# Patient Record
Sex: Female | Born: 1940 | Race: Black or African American | Hispanic: No | Smoking: Never smoker
Health system: Southern US, Community
[De-identification: ages and names within clinical notes are randomized; demographics above are authoritative.]

## PROBLEM LIST (undated history)

## (undated) DIAGNOSIS — T82868A Thrombosis of vascular prosthetic devices, implants and grafts, initial encounter: Secondary | ICD-10-CM

## (undated) DIAGNOSIS — E785 Hyperlipidemia, unspecified: Secondary | ICD-10-CM

## (undated) DIAGNOSIS — E119 Type 2 diabetes mellitus without complications: Secondary | ICD-10-CM

## (undated) DIAGNOSIS — E079 Disorder of thyroid, unspecified: Secondary | ICD-10-CM

## (undated) DIAGNOSIS — I10 Essential (primary) hypertension: Secondary | ICD-10-CM

## (undated) DIAGNOSIS — M069 Rheumatoid arthritis, unspecified: Secondary | ICD-10-CM

## (undated) DIAGNOSIS — I2510 Atherosclerotic heart disease of native coronary artery without angina pectoris: Secondary | ICD-10-CM

## (undated) DIAGNOSIS — I509 Heart failure, unspecified: Secondary | ICD-10-CM

## (undated) HISTORY — PX: CATARACT EXTRACTION: SUR2

## (undated) HISTORY — PX: CARDIAC SURGERY: SHX584

## (undated) HISTORY — PX: BLEPHAROPLASTY: SUR158

## (undated) HISTORY — PX: ABDOMINAL HYSTERECTOMY: SHX81

---

## 2010-06-15 ENCOUNTER — Ambulatory Visit: Payer: Self-pay

## 2010-10-22 DIAGNOSIS — M069 Rheumatoid arthritis, unspecified: Secondary | ICD-10-CM | POA: Insufficient documentation

## 2010-12-15 DIAGNOSIS — M169 Osteoarthritis of hip, unspecified: Secondary | ICD-10-CM | POA: Insufficient documentation

## 2011-06-01 DIAGNOSIS — Z9229 Personal history of other drug therapy: Secondary | ICD-10-CM | POA: Insufficient documentation

## 2011-09-05 DIAGNOSIS — Z955 Presence of coronary angioplasty implant and graft: Secondary | ICD-10-CM | POA: Insufficient documentation

## 2011-09-05 DIAGNOSIS — G4730 Sleep apnea, unspecified: Secondary | ICD-10-CM | POA: Insufficient documentation

## 2011-09-05 DIAGNOSIS — E669 Obesity, unspecified: Secondary | ICD-10-CM | POA: Insufficient documentation

## 2011-09-05 DIAGNOSIS — I10 Essential (primary) hypertension: Secondary | ICD-10-CM | POA: Insufficient documentation

## 2011-09-05 DIAGNOSIS — M1990 Unspecified osteoarthritis, unspecified site: Secondary | ICD-10-CM | POA: Insufficient documentation

## 2012-11-26 DIAGNOSIS — R739 Hyperglycemia, unspecified: Secondary | ICD-10-CM | POA: Insufficient documentation

## 2013-03-05 DIAGNOSIS — M179 Osteoarthritis of knee, unspecified: Secondary | ICD-10-CM | POA: Insufficient documentation

## 2013-03-05 DIAGNOSIS — M1710 Unilateral primary osteoarthritis, unspecified knee: Secondary | ICD-10-CM | POA: Insufficient documentation

## 2014-12-26 ENCOUNTER — Encounter: Payer: Self-pay | Admitting: *Deleted

## 2014-12-26 ENCOUNTER — Inpatient Hospital Stay
Admission: EM | Admit: 2014-12-26 | Discharge: 2014-12-29 | DRG: 689 | Disposition: A | Discharge status: Home / Self Care | Payer: Medicare Other | Attending: Internal Medicine | Admitting: Internal Medicine

## 2014-12-26 ENCOUNTER — Emergency Department: Payer: Medicare Other

## 2014-12-26 DIAGNOSIS — Z7952 Long term (current) use of systemic steroids: Secondary | ICD-10-CM

## 2014-12-26 DIAGNOSIS — G9340 Encephalopathy, unspecified: Secondary | ICD-10-CM | POA: Diagnosis not present

## 2014-12-26 DIAGNOSIS — N390 Urinary tract infection, site not specified: Principal | ICD-10-CM | POA: Diagnosis present

## 2014-12-26 DIAGNOSIS — E119 Type 2 diabetes mellitus without complications: Secondary | ICD-10-CM

## 2014-12-26 DIAGNOSIS — R509 Fever, unspecified: Secondary | ICD-10-CM

## 2014-12-26 DIAGNOSIS — Z7982 Long term (current) use of aspirin: Secondary | ICD-10-CM

## 2014-12-26 DIAGNOSIS — M069 Rheumatoid arthritis, unspecified: Secondary | ICD-10-CM | POA: Diagnosis present

## 2014-12-26 DIAGNOSIS — I110 Hypertensive heart disease with heart failure: Secondary | ICD-10-CM | POA: Diagnosis present

## 2014-12-26 DIAGNOSIS — I2510 Atherosclerotic heart disease of native coronary artery without angina pectoris: Secondary | ICD-10-CM | POA: Diagnosis present

## 2014-12-26 DIAGNOSIS — Z833 Family history of diabetes mellitus: Secondary | ICD-10-CM

## 2014-12-26 DIAGNOSIS — B349 Viral infection, unspecified: Secondary | ICD-10-CM | POA: Diagnosis present

## 2014-12-26 DIAGNOSIS — R000 Tachycardia, unspecified: Secondary | ICD-10-CM | POA: Diagnosis present

## 2014-12-26 DIAGNOSIS — Z8249 Family history of ischemic heart disease and other diseases of the circulatory system: Secondary | ICD-10-CM

## 2014-12-26 DIAGNOSIS — E079 Disorder of thyroid, unspecified: Secondary | ICD-10-CM | POA: Diagnosis present

## 2014-12-26 DIAGNOSIS — Z823 Family history of stroke: Secondary | ICD-10-CM

## 2014-12-26 DIAGNOSIS — I509 Heart failure, unspecified: Secondary | ICD-10-CM | POA: Diagnosis present

## 2014-12-26 DIAGNOSIS — I10 Essential (primary) hypertension: Secondary | ICD-10-CM | POA: Diagnosis present

## 2014-12-26 DIAGNOSIS — E785 Hyperlipidemia, unspecified: Secondary | ICD-10-CM | POA: Diagnosis present

## 2014-12-26 HISTORY — DX: Hyperlipidemia, unspecified: E78.5

## 2014-12-26 HISTORY — DX: Atherosclerotic heart disease of native coronary artery without angina pectoris: I25.10

## 2014-12-26 HISTORY — DX: Thrombosis due to vascular prosthetic devices, implants and grafts, initial encounter: T82.868A

## 2014-12-26 HISTORY — DX: Rheumatoid arthritis, unspecified: M06.9

## 2014-12-26 HISTORY — DX: Essential (primary) hypertension: I10

## 2014-12-26 HISTORY — DX: Heart failure, unspecified: I50.9

## 2014-12-26 HISTORY — DX: Disorder of thyroid, unspecified: E07.9

## 2014-12-26 HISTORY — DX: Type 2 diabetes mellitus without complications: E11.9

## 2014-12-26 MED ORDER — SODIUM CHLORIDE 0.9 % IV BOLUS (SEPSIS)
500.0000 mL | INTRAVENOUS | Status: AC
  Administered 2014-12-27: 500 mL via INTRAVENOUS

## 2014-12-26 MED ORDER — VANCOMYCIN HCL IN DEXTROSE 1-5 GM/200ML-% IV SOLN
1000.0000 mg | Freq: Once | INTRAVENOUS | Status: AC
  Administered 2014-12-27: 1000 mg via INTRAVENOUS
  Filled 2014-12-26: qty 200

## 2014-12-26 MED ORDER — PIPERACILLIN-TAZOBACTAM 3.375 G IVPB 30 MIN
3.3750 g | Freq: Once | INTRAVENOUS | Status: AC
  Administered 2014-12-27: 3.375 g via INTRAVENOUS
  Filled 2014-12-26: qty 50

## 2014-12-26 MED ORDER — SODIUM CHLORIDE 0.9 % IV BOLUS (SEPSIS)
1000.0000 mL | INTRAVENOUS | Status: AC
  Administered 2014-12-26 (×2): 1000 mL via INTRAVENOUS

## 2014-12-26 NOTE — ED Notes (Signed)
Pt was at work today,  Left work, went home, family ans friends say she was not acting right.  Pt does not know date or time of day.  Pt tried to start car battery dead, walked home, then tried to open her garage door with heer car keys.

## 2014-12-27 ENCOUNTER — Inpatient Hospital Stay
Admit: 2014-12-27 | Discharge: 2014-12-27 | Disposition: A | Discharge status: Home / Self Care | Payer: Medicare Other | Attending: Internal Medicine | Admitting: Internal Medicine

## 2014-12-27 ENCOUNTER — Inpatient Hospital Stay: Payer: Medicare Other

## 2014-12-27 ENCOUNTER — Encounter: Payer: Self-pay | Admitting: Internal Medicine

## 2014-12-27 ENCOUNTER — Emergency Department: Payer: Medicare Other

## 2014-12-27 DIAGNOSIS — I509 Heart failure, unspecified: Secondary | ICD-10-CM | POA: Diagnosis present

## 2014-12-27 DIAGNOSIS — E119 Type 2 diabetes mellitus without complications: Secondary | ICD-10-CM

## 2014-12-27 DIAGNOSIS — I10 Essential (primary) hypertension: Secondary | ICD-10-CM | POA: Diagnosis present

## 2014-12-27 DIAGNOSIS — Z833 Family history of diabetes mellitus: Secondary | ICD-10-CM | POA: Diagnosis not present

## 2014-12-27 DIAGNOSIS — I2510 Atherosclerotic heart disease of native coronary artery without angina pectoris: Secondary | ICD-10-CM | POA: Diagnosis present

## 2014-12-27 DIAGNOSIS — R000 Tachycardia, unspecified: Secondary | ICD-10-CM | POA: Diagnosis present

## 2014-12-27 DIAGNOSIS — Z7952 Long term (current) use of systemic steroids: Secondary | ICD-10-CM | POA: Diagnosis not present

## 2014-12-27 DIAGNOSIS — B349 Viral infection, unspecified: Secondary | ICD-10-CM | POA: Diagnosis present

## 2014-12-27 DIAGNOSIS — N390 Urinary tract infection, site not specified: Secondary | ICD-10-CM | POA: Diagnosis present

## 2014-12-27 DIAGNOSIS — M069 Rheumatoid arthritis, unspecified: Secondary | ICD-10-CM | POA: Diagnosis present

## 2014-12-27 DIAGNOSIS — E079 Disorder of thyroid, unspecified: Secondary | ICD-10-CM | POA: Diagnosis present

## 2014-12-27 DIAGNOSIS — E785 Hyperlipidemia, unspecified: Secondary | ICD-10-CM | POA: Diagnosis present

## 2014-12-27 DIAGNOSIS — G9340 Encephalopathy, unspecified: Secondary | ICD-10-CM | POA: Diagnosis present

## 2014-12-27 DIAGNOSIS — Z823 Family history of stroke: Secondary | ICD-10-CM | POA: Diagnosis not present

## 2014-12-27 DIAGNOSIS — Z7982 Long term (current) use of aspirin: Secondary | ICD-10-CM | POA: Diagnosis not present

## 2014-12-27 DIAGNOSIS — I110 Hypertensive heart disease with heart failure: Secondary | ICD-10-CM | POA: Diagnosis present

## 2014-12-27 DIAGNOSIS — Z8249 Family history of ischemic heart disease and other diseases of the circulatory system: Secondary | ICD-10-CM | POA: Diagnosis not present

## 2014-12-27 LAB — CBC WITH DIFFERENTIAL/PLATELET
Basophils Absolute: 0 10*3/uL (ref 0–0.1)
Basophils Relative: 0 %
EOS ABS: 0 10*3/uL (ref 0–0.7)
EOS PCT: 0 %
HCT: 43.6 % (ref 35.0–47.0)
Hemoglobin: 14.3 g/dL (ref 12.0–16.0)
LYMPHS ABS: 0.8 10*3/uL — AB (ref 1.0–3.6)
LYMPHS PCT: 7 %
MCH: 28.1 pg (ref 26.0–34.0)
MCHC: 32.8 g/dL (ref 32.0–36.0)
MCV: 85.8 fL (ref 80.0–100.0)
MONO ABS: 1.9 10*3/uL — AB (ref 0.2–0.9)
MONOS PCT: 16 %
Neutro Abs: 9.2 10*3/uL — ABNORMAL HIGH (ref 1.4–6.5)
Neutrophils Relative %: 77 %
PLATELETS: 154 10*3/uL (ref 150–440)
RBC: 5.08 MIL/uL (ref 3.80–5.20)
RDW: 15 % — AB (ref 11.5–14.5)
WBC: 12.1 10*3/uL — AB (ref 3.6–11.0)

## 2014-12-27 LAB — CBC
HEMATOCRIT: 38.4 % (ref 35.0–47.0)
HEMOGLOBIN: 12.4 g/dL (ref 12.0–16.0)
MCH: 27.9 pg (ref 26.0–34.0)
MCHC: 32.4 g/dL (ref 32.0–36.0)
MCV: 86 fL (ref 80.0–100.0)
Platelets: 135 10*3/uL — ABNORMAL LOW (ref 150–440)
RBC: 4.46 MIL/uL (ref 3.80–5.20)
RDW: 15 % — ABNORMAL HIGH (ref 11.5–14.5)
WBC: 9.4 10*3/uL (ref 3.6–11.0)

## 2014-12-27 LAB — GLUCOSE, CAPILLARY
GLUCOSE-CAPILLARY: 158 mg/dL — AB (ref 65–99)
GLUCOSE-CAPILLARY: 143 mg/dL — AB (ref 65–99)
Glucose-Capillary: 147 mg/dL — ABNORMAL HIGH (ref 65–99)
Glucose-Capillary: 140 mg/dL — ABNORMAL HIGH (ref 65–99)

## 2014-12-27 LAB — COMPREHENSIVE METABOLIC PANEL
ALT: 17 U/L (ref 14–54)
ANION GAP: 10 (ref 5–15)
AST: 23 U/L (ref 15–41)
Albumin: 3.7 g/dL (ref 3.5–5.0)
Alkaline Phosphatase: 95 U/L (ref 38–126)
BILIRUBIN TOTAL: 0.7 mg/dL (ref 0.3–1.2)
BUN: 17 mg/dL (ref 6–20)
CALCIUM: 9.3 mg/dL (ref 8.9–10.3)
CO2: 27 mmol/L (ref 22–32)
Chloride: 97 mmol/L — ABNORMAL LOW (ref 101–111)
Creatinine, Ser: 1.04 mg/dL — ABNORMAL HIGH (ref 0.44–1.00)
GFR, EST AFRICAN AMERICAN: 59 mL/min — AB (ref >60)
GFR, EST NON AFRICAN AMERICAN: 51 mL/min — AB (ref >60)
Glucose, Bld: 121 mg/dL — ABNORMAL HIGH (ref 65–99)
POTASSIUM: 3.5 mmol/L (ref 3.5–5.1)
Sodium: 134 mmol/L — ABNORMAL LOW (ref 135–145)
TOTAL PROTEIN: 7.2 g/dL (ref 6.5–8.1)

## 2014-12-27 LAB — BASIC METABOLIC PANEL
ANION GAP: 6 (ref 5–15)
BUN: 13 mg/dL (ref 6–20)
CHLORIDE: 102 mmol/L (ref 101–111)
CO2: 29 mmol/L (ref 22–32)
Calcium: 8.4 mg/dL — ABNORMAL LOW (ref 8.9–10.3)
Creatinine, Ser: 1.04 mg/dL — ABNORMAL HIGH (ref 0.44–1.00)
GFR calc Af Amer: 59 mL/min — ABNORMAL LOW (ref >60)
GFR, EST NON AFRICAN AMERICAN: 51 mL/min — AB (ref >60)
GLUCOSE: 164 mg/dL — AB (ref 65–99)
POTASSIUM: 3.5 mmol/L (ref 3.5–5.1)
Sodium: 137 mmol/L (ref 135–145)

## 2014-12-27 LAB — HEMOGLOBIN A1C: HEMOGLOBIN A1C: 6.7 % — AB (ref 4.0–6.0)

## 2014-12-27 LAB — URINALYSIS COMPLETE WITH MICROSCOPIC (ARMC ONLY)
BACTERIA UA: NONE SEEN
Bilirubin Urine: NEGATIVE
Glucose, UA: NEGATIVE mg/dL
Hgb urine dipstick: NEGATIVE
Nitrite: NEGATIVE
PH: 6 (ref 5.0–8.0)
PROTEIN: NEGATIVE mg/dL
Specific Gravity, Urine: 1.02 (ref 1.005–1.030)
Squamous Epithelial / LPF: NONE SEEN

## 2014-12-27 LAB — TROPONIN I: Troponin I: <0.03 ng/mL (ref <0.031)

## 2014-12-27 LAB — LIPID PANEL
Cholesterol: 176 mg/dL (ref 0–200)
HDL: 97 mg/dL (ref >40)
LDL CALC: 71 mg/dL (ref 0–99)
TRIGLYCERIDES: 40 mg/dL (ref <150)
Total CHOL/HDL Ratio: 1.8 RATIO
VLDL: 8 mg/dL (ref 0–40)

## 2014-12-27 LAB — APTT: APTT: 30 s (ref 24–36)

## 2014-12-27 LAB — LIPASE, BLOOD: Lipase: 39 U/L (ref 11–51)

## 2014-12-27 LAB — T4, FREE: Free T4: 0.95 ng/dL (ref 0.61–1.12)

## 2014-12-27 LAB — PROTIME-INR
INR: 1.07
Prothrombin Time: 14.1 seconds (ref 11.4–15.0)

## 2014-12-27 LAB — TSH: TSH: 0.85 u[IU]/mL (ref 0.350–4.500)

## 2014-12-27 LAB — LACTIC ACID, PLASMA: Lactic Acid, Venous: 1.4 mmol/L (ref 0.5–2.0)

## 2014-12-27 MED ORDER — CARVEDILOL 3.125 MG PO TABS: 3.1250 mg | ORAL_TABLET | Freq: Three times a day (TID) | ORAL | Status: DC

## 2014-12-27 MED ORDER — INFLUENZA VAC SPLIT QUAD 0.5 ML IM SUSY
0.5000 mL | PREFILLED_SYRINGE | INTRAMUSCULAR | Status: AC
  Administered 2014-12-28: 13:00:00 0.5 mL via INTRAMUSCULAR
  Filled 2014-12-27: qty 0.5

## 2014-12-27 MED ORDER — VANCOMYCIN HCL IN DEXTROSE 1-5 GM/200ML-% IV SOLN
1000.0000 mg | INTRAVENOUS | Status: DC
  Administered 2014-12-27: 18:00:00 1000 mg via INTRAVENOUS
  Filled 2014-12-27 (×4): qty 200

## 2014-12-27 MED ORDER — DIPHENHYDRAMINE HCL 50 MG/ML IJ SOLN
25.0000 mg | Freq: Once | INTRAMUSCULAR | Status: AC
  Administered 2014-12-27: 25 mg via INTRAVENOUS
  Filled 2014-12-27: qty 1

## 2014-12-27 MED ORDER — ACETAMINOPHEN 325 MG PO TABS
650.0000 mg | ORAL_TABLET | Freq: Four times a day (QID) | ORAL | Status: DC | PRN
  Administered 2014-12-27 – 2014-12-28 (×4): 650 mg via ORAL
  Filled 2014-12-27 (×4): qty 2

## 2014-12-27 MED ORDER — ACETAMINOPHEN 500 MG PO TABS
1000.0000 mg | ORAL_TABLET | Freq: Once | ORAL | Status: AC
  Administered 2014-12-27: 1000 mg via ORAL
  Filled 2014-12-27: qty 2

## 2014-12-27 MED ORDER — ACETAMINOPHEN 650 MG RE SUPP: 650.0000 mg | Freq: Four times a day (QID) | RECTAL | Status: DC | PRN

## 2014-12-27 MED ORDER — PIPERACILLIN-TAZOBACTAM 3.375 G IVPB
3.3750 g | Freq: Three times a day (TID) | INTRAVENOUS | Status: DC
  Administered 2014-12-27 – 2014-12-28 (×4): 3.375 g via INTRAVENOUS
  Filled 2014-12-27 (×6): qty 50

## 2014-12-27 MED ORDER — SODIUM CHLORIDE 0.9 % IJ SOLN
3.0000 mL | Freq: Two times a day (BID) | INTRAMUSCULAR | Status: DC
  Administered 2014-12-27 – 2014-12-29 (×5): 3 mL via INTRAVENOUS

## 2014-12-27 MED ORDER — ONDANSETRON HCL 4 MG/2ML IJ SOLN: 4.0000 mg | Freq: Four times a day (QID) | INTRAMUSCULAR | Status: DC | PRN

## 2014-12-27 MED ORDER — CARVEDILOL 3.125 MG PO TABS
3.1250 mg | ORAL_TABLET | Freq: Two times a day (BID) | ORAL | Status: DC
  Administered 2014-12-27 – 2014-12-29 (×5): 3.125 mg via ORAL
  Filled 2014-12-27 (×5): qty 1

## 2014-12-27 MED ORDER — ONDANSETRON HCL 4 MG PO TABS: 4.0000 mg | ORAL_TABLET | Freq: Four times a day (QID) | ORAL | Status: DC | PRN

## 2014-12-27 MED ORDER — ASPIRIN EC 81 MG PO TBEC
81.0000 mg | DELAYED_RELEASE_TABLET | Freq: Every day | ORAL | Status: DC
  Administered 2014-12-27 – 2014-12-29 (×3): 81 mg via ORAL
  Filled 2014-12-27 (×3): qty 1

## 2014-12-27 MED ORDER — INSULIN ASPART 100 UNIT/ML ~~LOC~~ SOLN
0.0000 [IU] | Freq: Three times a day (TID) | SUBCUTANEOUS | Status: DC
  Administered 2014-12-27: 18:00:00 2 [IU] via SUBCUTANEOUS
  Administered 2014-12-28: 13:00:00 1 [IU] via SUBCUTANEOUS
  Administered 2014-12-28: 2 [IU] via SUBCUTANEOUS
  Administered 2014-12-29: 1 [IU] via SUBCUTANEOUS
  Filled 2014-12-27: qty 2
  Filled 2014-12-27 (×2): qty 1
  Filled 2014-12-27: qty 2

## 2014-12-27 MED ORDER — PREDNISONE 1 MG PO TABS
2.0000 mg | ORAL_TABLET | Freq: Every day | ORAL | Status: DC
  Administered 2014-12-27 – 2014-12-29 (×3): 2 mg via ORAL
  Filled 2014-12-27 (×4): qty 2

## 2014-12-27 MED ORDER — STROKE: EARLY STAGES OF RECOVERY BOOK
Freq: Once | Status: AC
  Administered 2014-12-27: 05:00:00

## 2014-12-27 MED ORDER — ATORVASTATIN CALCIUM 10 MG PO TABS
10.0000 mg | ORAL_TABLET | Freq: Every day | ORAL | Status: DC
  Administered 2014-12-27 – 2014-12-29 (×3): 10 mg via ORAL
  Filled 2014-12-27 (×3): qty 1

## 2014-12-27 MED ORDER — METHOTREXATE 2.5 MG PO TABS
2.5000 mg | ORAL_TABLET | Freq: Every day | ORAL | Status: DC
  Administered 2014-12-27 – 2014-12-29 (×3): 2.5 mg via ORAL
  Filled 2014-12-27 (×3): qty 1

## 2014-12-27 MED ORDER — METOCLOPRAMIDE HCL 5 MG/ML IJ SOLN
10.0000 mg | Freq: Once | INTRAMUSCULAR | Status: AC
  Administered 2014-12-27: 10 mg via INTRAVENOUS
  Filled 2014-12-27: qty 2

## 2014-12-27 MED ORDER — ENOXAPARIN SODIUM 40 MG/0.4ML ~~LOC~~ SOLN
40.0000 mg | Freq: Every day | SUBCUTANEOUS | Status: DC
Start: 2014-12-27 — End: 2014-12-29
  Administered 2014-12-27 – 2014-12-29 (×3): 40 mg via SUBCUTANEOUS
  Filled 2014-12-27 (×3): qty 0.4

## 2014-12-27 NOTE — Consult Note (Signed)
CC: confusion   HPI: Tammy Obrien is an 74 y.o. female  who presents with acute encephalopathy. . Patient family state that the patient was found this evening at her home after having some amount of confusion during a telephone conversation with her daughter. Her other daughter subsequently went to her house to check on her and found her outside of her garage trying to open it with her car key. Pt is slow to respond but no focal abnormalities.   Past Medical History  Diagnosis Date  . CHF (congestive heart failure) (HCC)   . Thyroid disease   . Hypertension   . RA (rheumatoid arthritis) (HCC)   . Arterial stent thrombosis (HCC)   . CAD (coronary artery disease)   . HLD (hyperlipidemia)   . Type 2 diabetes mellitus [facility])     Past Surgical History  Procedure Laterality Date  . Abdominal hysterectomy    . Cataract extraction    . Cardiac surgery    . Blepharoplasty      Family History  Problem Relation Age of Onset  . Hypertension Mother   . Cancer Mother   . Glaucoma Mother   . Cancer Father   . Heart attack Sister   . Diabetes Brother   . Stroke Brother   . Glaucoma Brother     Social History:  reports that she has never smoked. She has never used smokeless tobacco. She reports that she does not drink alcohol or use illicit drugs.  Allergies  Allergen Reactions  . Enalaprilat   . Flexeril [Cyclobenzaprine] Itching  . Verapamil Itching  . Zocor [Simvastatin] Itching    Medications: I have reviewed the patient's current medications.  ROS: Not able to obtain due to confusion   Physical Examination: Blood pressure 154/61, pulse 86, temperature 102.5 F (39.2 C), temperature source Oral, resp. rate 20, height 5\' 6"  (1.676 m), weight 161 lb 9.6 oz (73.301 kg), SpO2 99 %.    Neurological Examination Mental Status: Tells me name and date  Cranial Nerves: II: Discs flat bilaterally; Visual fields grossly normal, pupils equal, round, reactive to light and  accommodation III,IV, VI: ptosis not present, extra-ocular motions intact bilaterally V,VII: smile symmetric, facial light touch sensation normal bilaterally VIII: hearing normal bilaterally IX,X: gag reflex present XI: bilateral shoulder shrug XII: midline tongue extension Motor: Right : Upper extremity   5/5    Left:     Upper extremity   5/5  Lower extremity   4+/5     Lower extremity   4+/5 Tone and bulk:normal tone throughout; no atrophy noted Sensory: Pinprick and light touch intact throughout, bilaterally Deep Tendon Reflexes: 2+ and symmetric throughout Plantars: Right: downgoing   Left: downgoing Cerebellar: normal finger-to-nose, normal rapid alternating movements and normal heel-to-shin test Gait: normal gait and station      Laboratory Studies:   Basic Metabolic Panel:  Recent Labs Lab 12/26/14 2341 12/27/14 0621  NA 134* 137  K 3.5 3.5  CL 97* 102  CO2 27 29  GLUCOSE 121* 164*  BUN 17 13  CREATININE 1.04* 1.04*  CALCIUM 9.3 8.4*    Liver Function Tests:  Recent Labs Lab 12/26/14 2341  AST 23  ALT 17  ALKPHOS 95  BILITOT 0.7  PROT 7.2  ALBUMIN 3.7    Recent Labs Lab 12/26/14 2341  LIPASE 39   No results for input(s): AMMONIA in the last 168 hours.  CBC:  Recent Labs Lab 12/26/14 2341 12/27/14 0621  WBC 12.1* 9.4  NEUTROABS 9.2*  --   HGB 14.3 12.4  HCT 43.6 38.4  MCV 85.8 86.0  PLT 154 135*    Cardiac Enzymes:  Recent Labs Lab 12/26/14 2341  TROPONINI <0.03    BNP: Invalid input(s): POCBNP  CBG:  Recent Labs Lab 12/27/14 0953 12/27/14 1311  GLUCAP 147* 140*    Microbiology: No results found for this or any previous visit.  Coagulation Studies:  Recent Labs  12/26/14 2341  LABPROT 14.1  INR 1.07    Urinalysis:  Recent Labs Lab 12/27/14  COLORURINE YELLOW*  LABSPEC 1.020  PHURINE 6.0  GLUCOSEU NEGATIVE  HGBUR NEGATIVE  BILIRUBINUR NEGATIVE  KETONESUR TRACE*  PROTEINUR NEGATIVE  NITRITE  NEGATIVE  LEUKOCYTESUR TRACE*    Lipid Panel:     Component Value Date/Time   CHOL 176 12/27/2014 0621   TRIG 40 12/27/2014 0621   HDL 97 12/27/2014 0621   CHOLHDL 1.8 12/27/2014 0621   VLDL 8 12/27/2014 0621   LDLCALC 71 12/27/2014 0621    HgbA1C: No results found for: HGBA1C  Urine Drug Screen:  No results found for: LABOPIA, COCAINSCRNUR, LABBENZ, AMPHETMU, THCU, LABBARB  Alcohol Level: No results for input(s): ETH in the last 168 hours.  Other results: EKG: normal EKG, normal sinus rhythm, unchanged from previous tracings.  Imaging: Ct Head Wo Contrast  12/27/2014  CLINICAL DATA:  Altered mental status, acute onset. Initial encounter. EXAM: CT HEAD WITHOUT CONTRAST TECHNIQUE: Contiguous axial images were obtained from the base of the skull through the vertex without intravenous contrast. COMPARISON:  None. FINDINGS: There is no evidence of acute infarction, mass lesion, or intra- or extra-axial hemorrhage on CT. The posterior fossa, including the cerebellum, brainstem and fourth ventricle, is within normal limits. The third and lateral ventricles, and basal ganglia are unremarkable in appearance. The cerebral hemispheres are symmetric in appearance, with normal gray-white differentiation. No mass effect or midline shift is seen. There is no evidence of fracture; visualized osseous structures are unremarkable in appearance. The visualized portions of the orbits are within normal limits. The paranasal sinuses and mastoid air cells are well-aerated. No significant soft tissue abnormalities are seen. IMPRESSION: Unremarkable noncontrast CT of the head. Electronically Signed   By: Roanna Raider M.D.   On: 12/27/2014 00:39   Mr Brain Wo Contrast  12/27/2014  CLINICAL DATA:  Acute encephalopathy. Confusion. Patient is febrile, possible sepsis. History of diabetes. History of hyperlipidemia and hypertension. EXAM: MRI HEAD WITHOUT CONTRAST MRA HEAD WITHOUT CONTRAST TECHNIQUE: Multiplanar,  multiecho pulse sequences of the brain and surrounding structures were obtained without intravenous contrast. Angiographic images of the head were obtained using MRA technique without contrast. COMPARISON:  CT head earlier today. FINDINGS: The patient was unable to remain motionless for the exam. Small or subtle lesions could be overlooked. MRI HEAD FINDINGS No visible restricted diffusion. No hemorrhage, mass lesion, hydrocephalus, or extra-axial fluid. Generalized atrophy. Mild subcortical and periventricular T2 and FLAIR hyperintensities, likely chronic microvascular ischemic change. Pituitary and cerebellar tonsils unremarkable. Cervical spondylosis with suspected disc osteophyte complex at C3-4. No visible foci of chronic hemorrhage. Flow voids are maintained. Extracranial soft tissues grossly unremarkable. Compared with prior CT, good general agreement. MRA HEAD FINDINGS Motion degraded exam. BILATERAL cavernous carotid narrowing inferiorly is felt to be artifactual, but focal stenoses are not excluded. Supraclinoid ICAs widely patent. Basilar artery widely patent with vertebrals codominant. Unremarkable proximal anterior and middle cerebral artery on the RIGHT. Mildly irregular proximal LEFT middle cerebral artery. Severely diseased LEFT A1 ACA.  LEFT PCA demonstrates a mixed fetal/native origin from the basilar, with focal narrowing at the proximal LEFT P2 segment potentially flow reducing. Mild irregularity of the distal MCA and PCA branches suggesting intracranial atherosclerotic change. No intracranial aneurysm. IMPRESSION: Motion degraded MRI brain exam demonstrating atrophy and small vessel disease without definite acute intracranial findings. Motion degraded MRA with suspected artifactual narrowing of the cavernous carotid arteries bilaterally. Moderately diseased LEFT anterior cerebral artery proximally. Electronically Signed   By: Elsie Stain M.D.   On: 12/27/2014 12:19   US Carotid  Bilateral  12/27/2014  CLINICAL DATA:  Acute encephalopathy. Hypertension, stroke, diabetes. EXAM: BILATERAL CAROTID DUPLEX ULTRASOUND TECHNIQUE: Wallace Cullens scale imaging, color Doppler and duplex ultrasound was performed of bilateral carotid and vertebral arteries in the neck. COMPARISON:  None. REVIEW OF SYSTEMS: Quantification of carotid stenosis is based on velocity parameters that correlate the residual internal carotid diameter with NASCET-based stenosis levels, using the diameter of the distal internal carotid lumen as the denominator for stenosis measurement. The following velocity measurements were obtained: PEAK SYSTOLIC/END DIASTOLIC RIGHT ICA:                     103/18cm/sec CCA:                     118/17cm/sec SYSTOLIC ICA/CCA RATIO:  0.88 DIASTOLIC ICA/CCA RATIO: 1.07 ECA:                     82cm/sec LEFT ICA:                     83 /11cm/sec CCA:                     116/12cm/sec SYSTOLIC ICA/CCA RATIO:  0.71 DIASTOLIC ICA/CCA RATIO: 1.46 ECA:                     83cm/sec FINDINGS: RIGHT CAROTID ARTERY: Eccentric partially calcified plaque in the bulb extending into the proximal ICA, without high-grade stenosis. Normal waveforms and color Doppler signal. RIGHT VERTEBRAL ARTERY:  Normal flow direction and waveform. LEFT CAROTID ARTERY: Intimal thickening through the common carotid artery. Eccentric noncalcified plaque in the bulb and proximal ICA. No high-grade stenosis. Normal waveforms and color Doppler signal. LEFT VERTEBRAL ARTERY: Normal flow direction and waveform. IMPRESSION: 1. Bilateral carotid bifurcation and proximal ICA plaque resulting in less than 50% diameter stenosis. The exam does not exclude plaque ulceration or embolization. Continued surveillance recommended. Electronically Signed   By: Corlis Leak M.D.   On: 12/27/2014 11:13   Dg Chest Port 1 View  12/26/2014  CLINICAL DATA:  Altered mental status EXAM: PORTABLE CHEST 1 VIEW COMPARISON:  None. FINDINGS: Lungs are clear. Heart size  and pulmonary vascularity are normal. No adenopathy. No bone lesions. IMPRESSION: No edema or consolidation. Electronically Signed   By: Bretta Bang III M.D.   On: 12/26/2014 23:59   Mr Palma Holter  12/27/2014  CLINICAL DATA:  Acute encephalopathy. Confusion. Patient is febrile, possible sepsis. History of diabetes. History of hyperlipidemia and hypertension. EXAM: MRI HEAD WITHOUT CONTRAST MRA HEAD WITHOUT CONTRAST TECHNIQUE: Multiplanar, multiecho pulse sequences of the brain and surrounding structures were obtained without intravenous contrast. Angiographic images of the head were obtained using MRA technique without contrast. COMPARISON:  CT head earlier today. FINDINGS: The patient was unable to remain motionless for the exam. Small or subtle lesions could be overlooked. MRI HEAD FINDINGS No visible  restricted diffusion. No hemorrhage, mass lesion, hydrocephalus, or extra-axial fluid. Generalized atrophy. Mild subcortical and periventricular T2 and FLAIR hyperintensities, likely chronic microvascular ischemic change. Pituitary and cerebellar tonsils unremarkable. Cervical spondylosis with suspected disc osteophyte complex at C3-4. No visible foci of chronic hemorrhage. Flow voids are maintained. Extracranial soft tissues grossly unremarkable. Compared with prior CT, good general agreement. MRA HEAD FINDINGS Motion degraded exam. BILATERAL cavernous carotid narrowing inferiorly is felt to be artifactual, but focal stenoses are not excluded. Supraclinoid ICAs widely patent. Basilar artery widely patent with vertebrals codominant. Unremarkable proximal anterior and middle cerebral artery on the RIGHT. Mildly irregular proximal LEFT middle cerebral artery. Severely diseased LEFT A1 ACA. LEFT PCA demonstrates a mixed fetal/native origin from the basilar, with focal narrowing at the proximal LEFT P2 segment potentially flow reducing. Mild irregularity of the distal MCA and PCA branches suggesting intracranial  atherosclerotic change. No intracranial aneurysm. IMPRESSION: Motion degraded MRI brain exam demonstrating atrophy and small vessel disease without definite acute intracranial findings. Motion degraded MRA with suspected artifactual narrowing of the cavernous carotid arteries bilaterally. Moderately diseased LEFT anterior cerebral artery proximally. Electronically Signed   By: Elsie Stain M.D.   On: 12/27/2014 12:19     Assessment/Plan:  74 y.o. female  who presents with acute encephalopathy. . Patient family state that the patient was found this evening at her home after having some amount of confusion during a telephone conversation with her daughter. Her other daughter subsequently went to her house to check on her and found her outside of her garage trying to open it with her car key. Pt is slow to respond but no focal abnormalities.   MRI/MRA no acute abnormalities Fever of 102, but no Kernig and Burdinski signs Very unlikely to be bacterial meningitis Slight UTI that is being treated If still febrile in 1-2 days would obtain LP under IR S/p discussion with family at bedside Pauletta Browns  12/27/2014, 2:35 PM

## 2014-12-27 NOTE — Plan of Care (Signed)
Problem: Discharge Progression Outcomes Goal: Other Discharge Outcomes/Goals Outcome: Progressing Plan of care progress to goals: 1. No c/o pain, resting comfortably in bed.  2. Hemodynamically:             -VSS, afebrile              -NIH 0, intermittent difficulty formulating thoughts into words             -MRI, Echo, US Carotid to be completed today  3. Tolerating carb modified diet, swallowing without problem  4. Moderate fall risk. Bed alarm on, hourly rounding. Understands how to call for assistance.

## 2014-12-27 NOTE — Progress Notes (Signed)
OT Cancellation Note  Patient Details Name: Tammy Obrien MRN: [id number] DOB: Sep 12, 1940   Cancelled Treatment:    Reason Eval/Treat Not Completed: Patient at procedure or test/ unavailable: Out for MRI, Echo, and [place] of Carotids.  [name] Toma Copier, MOTR/L 12/27/2014, 11:14 AM

## 2014-12-27 NOTE — Progress Notes (Signed)
[facility] [place] [facility]   PATIENT NAME: Tammy Obrien    MR#:  [id number]  DATE OF BIRTH:  [birth year]  SUBJECTIVE:  Patient appears slightly confused. She is able to answer all questions properly but is very slow.  REVIEW OF SYSTEMS:    Review of Systems  Constitutional: Negative for fever, chills and malaise/fatigue.  HENT: Negative for sore throat.   Eyes: Negative for blurred vision.  Respiratory: Negative for cough, hemoptysis, shortness of breath and wheezing.   Cardiovascular: Negative for chest pain, palpitations and leg swelling.  Gastrointestinal: Negative for nausea, vomiting, abdominal pain, diarrhea and blood in stool.  Genitourinary: Negative for dysuria.  Musculoskeletal: Negative for back pain.  Neurological: Negative for dizziness, tremors, focal weakness and headaches.  Endo/Heme/Allergies: Does not bruise/bleed easily.    Tolerating Diet: Yes      DRUG ALLERGIES:   Allergies  Allergen Reactions  . Enalaprilat   . Flexeril [Cyclobenzaprine] Itching  . Verapamil Itching  . Zocor [Simvastatin] Itching    VITALS:  Blood pressure 135/89, pulse 80, temperature 98.9 F (37.2 C), temperature source Oral, resp. rate 18, height 5\' 6"  (1.676 m), weight 73.301 kg (161 lb 9.6 oz), SpO2 100 %.  PHYSICAL EXAMINATION:   Physical Exam  Constitutional: She is oriented to person, place, and time and well-developed, well-nourished, and in no distress. No distress.  HENT:  Head: Normocephalic.  Eyes: No scleral icterus.  Neck: Normal range of motion. Neck supple. No JVD present. No tracheal deviation present.  Cardiovascular: Normal rate, regular rhythm and normal heart sounds.  Exam reveals no gallop and no friction rub.   No murmur heard. Pulmonary/Chest: Effort normal and breath sounds normal. No respiratory distress. She has no wheezes. She has no rales. She exhibits no tenderness.  Abdominal: Soft. Bowel sounds are  normal. She exhibits no distension and no mass. There is no tenderness. There is no rebound and no guarding.  Musculoskeletal: Normal range of motion. She exhibits no edema.  Neurological: She is alert and oriented to person, place, and time. Coordination abnormal.  SEEMS CONFUSED AT TIMES AND IS ABLE TO PERFORM LEFT HAND FINGER TO NOSE TEST WELL  Skin: Skin is warm. No rash noted. No erythema.  Psychiatric: Affect and judgment normal.      LABORATORY PANEL:   CBC  Recent Labs Lab 12/27/14 0621  WBC 9.4  HGB 12.4  HCT 38.4  PLT 135*   ------------------------------------------------------------------------------------------------------------------  Chemistries   Recent Labs Lab 12/26/14 2341 12/27/14 0621  NA 134* 137  K 3.5 3.5  CL 97* 102  CO2 27 29  GLUCOSE 121* 164*  BUN 17 13  CREATININE 1.04* 1.04*  CALCIUM 9.3 8.4*  AST 23  --   ALT 17  --   ALKPHOS 95  --   BILITOT 0.7  --    ------------------------------------------------------------------------------------------------------------------  Cardiac Enzymes  Recent Labs Lab 12/26/14 2341  TROPONINI <0.03   ------------------------------------------------------------------------------------------------------------------  RADIOLOGY:  Ct Head Wo Contrast  12/27/2014  CLINICAL DATA:  Altered mental status, acute onset. Initial encounter. EXAM: CT HEAD WITHOUT CONTRAST TECHNIQUE: Contiguous axial images were obtained from the base of the skull through the vertex without intravenous contrast. COMPARISON:  None. FINDINGS: There is no evidence of acute infarction, mass lesion, or intra- or extra-axial hemorrhage on CT. The posterior fossa, including the cerebellum, brainstem and fourth ventricle, is within normal limits. The third and lateral ventricles, and basal ganglia are unremarkable in appearance. The cerebral hemispheres  are symmetric in appearance, with normal gray-white differentiation. No mass effect  or midline shift is seen. There is no evidence of fracture; visualized osseous structures are unremarkable in appearance. The visualized portions of the orbits are within normal limits. The paranasal sinuses and mastoid air cells are well-aerated. No significant soft tissue abnormalities are seen. IMPRESSION: Unremarkable noncontrast CT of the head. Electronically Signed   By: Roanna Raider M.D.   On: 12/27/2014 00:39   US Carotid Bilateral  12/27/2014  CLINICAL DATA:  Acute encephalopathy. Hypertension, stroke, diabetes. EXAM: BILATERAL CAROTID DUPLEX ULTRASOUND TECHNIQUE: Wallace Cullens scale imaging, color Doppler and duplex ultrasound was performed of bilateral carotid and vertebral arteries in the neck. COMPARISON:  None. REVIEW OF SYSTEMS: Quantification of carotid stenosis is based on velocity parameters that correlate the residual internal carotid diameter with NASCET-based stenosis levels, using the diameter of the distal internal carotid lumen as the denominator for stenosis measurement. The following velocity measurements were obtained: PEAK SYSTOLIC/END DIASTOLIC RIGHT ICA:                     103/18cm/sec CCA:                     118/17cm/sec SYSTOLIC ICA/CCA RATIO:  0.88 DIASTOLIC ICA/CCA RATIO: 1.07 ECA:                     82cm/sec LEFT ICA:                     83 /11cm/sec CCA:                     116/12cm/sec SYSTOLIC ICA/CCA RATIO:  0.71 DIASTOLIC ICA/CCA RATIO: 1.46 ECA:                     83cm/sec FINDINGS: RIGHT CAROTID ARTERY: Eccentric partially calcified plaque in the bulb extending into the proximal ICA, without high-grade stenosis. Normal waveforms and color Doppler signal. RIGHT VERTEBRAL ARTERY:  Normal flow direction and waveform. LEFT CAROTID ARTERY: Intimal thickening through the common carotid artery. Eccentric noncalcified plaque in the bulb and proximal ICA. No high-grade stenosis. Normal waveforms and color Doppler signal. LEFT VERTEBRAL ARTERY: Normal flow direction and waveform.  IMPRESSION: 1. Bilateral carotid bifurcation and proximal ICA plaque resulting in less than 50% diameter stenosis. The exam does not exclude plaque ulceration or embolization. Continued surveillance recommended. Electronically Signed   By: Corlis Leak M.D.   On: 12/27/2014 11:13   Dg Chest Port 1 View  12/26/2014  CLINICAL DATA:  Altered mental status EXAM: PORTABLE CHEST 1 VIEW COMPARISON:  None. FINDINGS: Lungs are clear. Heart size and pulmonary vascularity are normal. No adenopathy. No bone lesions. IMPRESSION: No edema or consolidation. Electronically Signed   By: Bretta Bang III M.D.   On: 12/26/2014 23:59     ASSESSMENT AND PLAN:   This is a 74 year old female who presented with acute encephalopathy.  1. Acute encephalopathy: This is likely secondary to stroke. Patient is to undergo stroke workup. Carotid Dopplers showed no hemodynamically significant stenosis. MRI and echocardiogram are pending.  Continue aspirin and statin.  2. Essential hypertension: Continue Coreg  3. Hyperlipidemia: LDL is at goal. Continue atorvastatin  4. Diabetes type 2: Continue ADA diet and signs on 7  5. Rheumatoid arthritis: Continue methotrexate and prednisone  Management plans discussed with the patient and family she is in agreement.  CODE STATUS: FULL  TOTAL  TIME TAKING CARE OF THIS PATIENT: 30 minutes.     POSSIBLE D/C 1 days, DEPENDING ON CLINICAL CONDITION.   Ihsan Nomura M.D on 12/27/2014 at 11:21 AM  Between 7am to 6pm - Pager - [phone] After 6pm go to [email or web address] - password [email or web address]  [facility]  Office  [phone]  CC: Primary care physician; Leim Fabry, MD  Note: This dictation was prepared with Dragon dictation along with smaller phrase technology. Any transcriptional errors that result from this process are unintentional.

## 2014-12-27 NOTE — Progress Notes (Signed)
Physical Therapy Evaluation Patient Details Name: Tammy Obrien MRN: [id number] DOB: Apr 20, 1940 Today's Date: 12/27/2014   History of Present Illness  Patient is a 74 y.o. female admitted on 28 Oct. for acute encephalopathy. Patient has hx of DMII, HTN, hyperlipidemia, CAD, and RA.  Clinical Impression  Patient is a 74 y.o. Female who was previously independent before recent admission. Patient was oriented to person and place at time of evaluation. Required daughter at bedside to explain why she was at [facility]. Patient demonstrated independence with bed mobility and transfers but required HHA for standing dynamic balance activities and walking. Patient will benefit from a [facility] at home to allow for improved balance and decreased risk for falls once discharged at cognitive baseline. No further f/u is indicated for PT at this time.    Follow Up Recommendations No PT follow up    Equipment Recommendations  Cane    Recommendations for Other Services       Precautions / Restrictions Precautions Precautions: None Restrictions Weight Bearing Restrictions: No      Mobility  Bed Mobility Overal bed mobility: Independent                Transfers Overall transfer level: Independent Equipment used: 1 person hand held assist                Ambulation/Gait Ambulation/Gait assistance: Modified independent (Device/Increase time) Ambulation Distance (Feet): 230 Feet Assistive device: 1 person hand held assist Gait Pattern/deviations: WFL(Within Functional Limits)     General Gait Details: Patient ambulates at decreased cadence. Was able to perform head turns with no LOB with HHA.   Stairs            Wheelchair Mobility    Modified Rankin (Stroke Patients Only)       Balance Overall balance assessment: Needs assistance Sitting-balance support: No upper extremity supported Sitting balance-Leahy Scale: Good     Standing balance support: Single extremity  supported Standing balance-Leahy Scale: Good Standing balance comment: Patient able to stand with feet hip width apart and UE supported Single Leg Stance - Right Leg: 10 (Needs HHA) Single Leg Stance - Left Leg: 10 (needs HHA)         High level balance activites: Head turns;Other (comment) (Able to perform with HHA)               Pertinent Vitals/Pain Pain Assessment: No/denies pain    Home Living Family/patient expects to be discharged to:: Private residence Living Arrangements: Alone Available Help at Discharge: Family;Available PRN/intermittently Type of Home: House Home Access: Stairs to enter Entrance Stairs-Rails: Can reach both Entrance Stairs-Number of Steps: 4 Home Layout: One level Home Equipment: None      Prior Function Level of Independence: Independent               Hand Dominance        Extremity/Trunk Assessment   Upper Extremity Assessment: Overall WFL for tasks assessed           Lower Extremity Assessment: Overall WFL for tasks assessed         Communication   Communication: No difficulties  Cognition Arousal/Alertness: Awake/alert Behavior During Therapy: WFL for tasks assessed/performed Overall Cognitive Status: Impaired/Different from baseline Area of Impairment: Orientation Orientation Level: Situation             General Comments: Patient was able to demonstrate long term memory recall and was oriented to person and place.    General Comments  Exercises        Assessment/Plan    PT Assessment Patent does not need any further PT services  PT Diagnosis Altered mental status   PT Problem List    PT Treatment Interventions     PT Goals (Current goals can be found in the Care Plan section) Acute Rehab PT Goals Patient Stated Goal: "To determine why this happened." PT Goal Formulation: With patient/family Time For Goal Achievement: 01/10/15 Potential to Achieve Goals: Good    Frequency      Barriers to discharge        Co-evaluation               End of Session Equipment Utilized During Treatment: Gait belt Activity Tolerance: Patient tolerated treatment well Patient left: in bed;with call bell/phone within reach;with family/visitor present           Time: 0910-0930 PT Time Calculation (min) (ACUTE ONLY): 20 min   Charges:   PT Evaluation $Initial PT Evaluation Tier I: 1 Procedure     PT G Codes:        Neita Carp, PT, DPT 12/27/2014, 10:05 AM

## 2014-12-27 NOTE — ED Provider Notes (Signed)
[facility] Emergency Department Provider Note  ____________________________________________  Time seen: 11:25 PM on arrival by EMS  I have reviewed the triage vital signs and the nursing notes.   HISTORY  Chief Complaint Altered Mental Status  history obtained by friend and family at bedside, and by patient   HPI Tammy Obrien is a 74 y.o. female who had sudden onset of confusion at 4:45 PM today.Reportedly patient was trying to start her car with the wrong key and so she thought that the car was dead so she walked home and was tried open her garage or with her car keys, was found in her driveway in this state. Denies chest pain shortness of breath or trauma. No abdominal pain nausea vomiting or diarrhea.     Past Medical History  Diagnosis Date  . CHF (congestive heart failure) (HCC)   . Thyroid disease   . Hypertension   . RA (rheumatoid arthritis) (HCC)   . Arterial stent thrombosis (HCC)   . CAD (coronary artery disease)   . HLD (hyperlipidemia)   . Type 2 diabetes mellitus [facility])      Patient Active Problem List   Diagnosis Date Noted  . Acute encephalopathy 12/27/2014  . Type 2 diabetes mellitus (HCC) 12/27/2014  . HTN (hypertension) 12/27/2014  . HLD (hyperlipidemia) 12/27/2014  . CAD (coronary artery disease) 12/27/2014  . RA (rheumatoid arthritis) (HCC) 12/27/2014     Past Surgical History  Procedure Laterality Date  . Abdominal hysterectomy    . Cataract extraction    . Cardiac surgery    . Blepharoplasty       Current Outpatient Rx  Name  Route  Sig  Dispense  Refill  . aspirin EC 81 MG tablet   Oral   Take 1 tablet by mouth daily.         . Multiple Vitamins-Minerals (MULTIVITAMIN ADULT PO)   Oral   Take 1 tablet by mouth daily.         Marland Kitchen atorvastatin (LIPITOR) 10 MG tablet   Oral   Take 1 tablet by mouth daily.         . carvedilol (COREG) 3.125 MG tablet   Oral   Take 1 tablet by mouth 3 (three) times  daily.         . Cholecalciferol (VITAMIN D3) 5000 UNITS TABS   Oral   Take 1 tablet by mouth daily.         . folic acid (FOLVITE) 1 MG tablet   Oral   Take 1 tablet by mouth daily.         . methotrexate (RHEUMATREX) 2.5 MG tablet   Oral   Take 1 tablet by mouth daily.         . potassium chloride SA (K-DUR,KLOR-CON) 20 MEQ tablet   Oral   Take 2 tablets by mouth daily.         . predniSONE (DELTASONE) 1 MG tablet   Oral   Take 2 tablets by mouth daily.         . traMADol (ULTRAM) 50 MG tablet   Oral   Take 1 tablet by mouth 2 (two) times daily.         Marland Kitchen triamcinolone ointment (KENALOG) 0.1 %   Topical   Apply 1 application topically daily as needed.         . triamterene-hydrochlorothiazide (DYAZIDE) 37.5-25 MG capsule   Oral   Take 1 capsule by mouth daily.         Marland Kitchen  ZETIA 10 MG tablet   Oral   Take 1 tablet by mouth daily.           Dispense as written.      Allergies Enalaprilat; Flexeril; Verapamil; and Zocor   Family History  Problem Relation Age of Onset  . Hypertension Mother   . Cancer Mother   . Glaucoma Mother   . Cancer Father   . Heart attack Sister   . Diabetes Brother   . Stroke Brother   . Glaucoma Brother     Social History Social History  Substance Use Topics  . Smoking status: Never Smoker   . Smokeless tobacco: Never Used  . Alcohol Use: No    Review of Systems  Constitutional:   No fever or chills. No weight changes Eyes:   No blurry vision or double vision.  ENT:   No sore throat. Cardiovascular:   No chest pain. Respiratory:   No dyspnea or cough. Gastrointestinal:   Negative for abdominal pain, vomiting and diarrhea.  No BRBPR or melena. Genitourinary:   Negative for dysuria, urinary retention, bloody urine, or difficulty urinating. Musculoskeletal:   Negative for back pain. No joint swelling or pain. Skin:   Negative for rash. Neurological:   positive bilateral frontal headache, confusion, no  focal weakness or paresthesia Psychiatric:  No anxiety or depression.   Endocrine:  No hot/cold intolerance, changes in energy, or sleep difficulty.  10-point ROS otherwise negative.  ____________________________________________   PHYSICAL EXAM:  VITAL SIGNS: ED Triage Vitals  Enc Vitals Group     BP 12/26/14 2334 164/76 mmHg     Pulse Rate 12/26/14 2334 104     Resp 12/27/14 0000 23     Temp 12/26/14 2334 101.7 F (38.7 C)     Temp Source 12/26/14 2334 Oral     SpO2 12/26/14 2327 96 %     Weight 12/26/14 2334 175 lb (79.379 kg)     Height 12/26/14 2334 5\' 6"  (1.676 m)     Head Cir --      Peak Flow --      Pain Score --      Pain Loc --      Pain Edu? --      Excl. in GC? --      Constitutional:   Alert and oriented to person and place. Well appearing and in no distress. Eyes:   No scleral icterus. No conjunctival pallor. PERRL. EOMI ENT   Head:   Normocephalic and atraumatic.   Nose:   No congestion/rhinnorhea. No septal hematoma   Mouth/Throat:   MMM, no pharyngeal erythema. No peritonsillar mass. No uvula shift.   Neck:   No stridor. No SubQ emphysema. No meningismus. Hematological/Lymphatic/Immunilogical:   No cervical lymphadenopathy. Cardiovascular:   Tachycardia heart rate 105. Normal and symmetric distal pulses are present in all extremities. No murmurs, rubs, or gallops. Respiratory:   Normal respiratory effort without tachypnea nor retractions. Breath sounds are clear and equal bilaterally. No wheezes/rales/rhonchi. Gastrointestinal:   Soft and nontender. No distention. There is no CVA tenderness.  No rebound, rigidity, or guarding. Genitourinary:   deferred Musculoskeletal:   Nontender with normal range of motion in all extremities. No joint effusions.  No lower extremity tenderness.  No edema. Neurologic:   Normal speech and language.  CN 2-10 normal. Motor grossly intact. No pronator drift.  Normal gait. Finger to nose No gross focal  neurologic deficits are appreciated. Poor recall of the events today Skin:  Skin is warm, dry and intact. No rash noted.  No petechiae, purpura, or bullae. Psychiatric:   Mood and affect are normal. Impaired insight presently ____________________________________________    LABS (pertinent positives/negatives) (all labs ordered are listed, but only abnormal results are displayed) Labs Reviewed  COMPREHENSIVE METABOLIC PANEL - Abnormal; Notable for the following:    Sodium 134 (*)    Chloride 97 (*)    Glucose, Bld 121 (*)    Creatinine, Ser 1.04 (*)    GFR calc non Af Amer 51 (*)    GFR calc Af Amer 59 (*)    All other components within normal limits  CBC WITH DIFFERENTIAL/PLATELET - Abnormal; Notable for the following:    WBC 12.1 (*)    RDW 15.0 (*)    Neutro Abs 9.2 (*)    Lymphs Abs 0.8 (*)    Monocytes Absolute 1.9 (*)    All other components within normal limits  URINALYSIS COMPLETEWITH MICROSCOPIC ([facility] ONLY) - Abnormal; Notable for the following:    Color, Urine YELLOW (*)    APPearance CLEAR (*)    Ketones, ur TRACE (*)    Leukocytes, UA TRACE (*)    All other components within normal limits  CULTURE, BLOOD (ROUTINE X 2)  CULTURE, BLOOD (ROUTINE X 2)  CULTURE, EXPECTORATED SPUTUM-ASSESSMENT  URINE CULTURE  LACTIC ACID, PLASMA  LIPASE, BLOOD  TROPONIN I  APTT  PROTIME-INR  TSH  T4, FREE  LACTIC ACID, PLASMA   ____________________________________________   EKG  Interpreted by me Sinus rhythm rate of 94, normal axis intervals QRS and ST segments and T waves  ____________________________________________    RADIOLOGY  Chest x-ray unremarkable CT head unremarkable  ____________________________________________   PROCEDURES CRITICAL CARE Performed by: Scotty Court, Lorie Melichar   Total critical care time: 35 minutes  Critical care time was exclusive of separately billable procedures and treating other patients.  Critical care was necessary to treat or  prevent imminent or life-threatening deterioration.  Critical care was time spent personally by me on the following activities: development of treatment plan with patient and/or surrogate as well as nursing, discussions with consultants, evaluation of patient's response to treatment, examination of patient, obtaining history from patient or surrogate, ordering and performing treatments and interventions, ordering and review of laboratory studies, ordering and review of radiographic studies, pulse oximetry and re-evaluation of patient's condition.   ____________________________________________   INITIAL IMPRESSION / ASSESSMENT AND PLAN / ED COURSE  Pertinent labs & imaging results that were available during my care of the patient were reviewed by me and considered in my medical decision making (see chart for details).  Code sepsis initiated immediately upon arrival due to fever tachycardia and altered mental status. Given saline boluses and vancomycin and Zosyn. Due to recent history of an ongoing thyroid workup, we also added thyroid labs.  ----------------------------------------- 3:29 AM on 12/27/2014 -----------------------------------------  Patient remains hematologically stable after initial fluid boluses. Major ongoing complaint is headache. Confusion is gradually improving. Initial workup with labs CT chest x-ray and urinalysis all essentially unremarkable. Fevers resolved. Case discussed with the hospitalist, most likely an ischemic stroke, low suspicion of meningitis or encephalitis is no history of recent illness, there is no meningismus, and the patient is nontoxic appearing.     ____________________________________________   FINAL CLINICAL IMPRESSION(S) / ED DIAGNOSES  Final diagnoses:  Acute encephalopathy  Fever, unspecified fever cause      Sharman Cheek, MD 12/27/14 0330

## 2014-12-27 NOTE — Progress Notes (Signed)
ANTIBIOTIC CONSULT NOTE - INITIAL  Pharmacy Consult for vancomycin/Zosyn Indication: rule out sepsis  Allergies  Allergen Reactions  . Flexeril [Cyclobenzaprine] Itching  . Verapamil Itching  . Zocor [Simvastatin] Itching    Patient Measurements: Height: 5\' 6"  (167.6 cm) Weight: 175 lb (79.379 kg) IBW/kg (Calculated) : 59.3 Adjusted Body Weight: 67.3 kg  Vital Signs: Temp: 101.7 F (38.7 C) (10/28 2334) Temp Source: Oral (10/28 2334) BP: 179/84 mmHg (10/29 0100) Pulse Rate: 92 (10/29 0100) Intake/Output from previous day:   Intake/Output from this shift:    Labs:  Recent Labs  12/26/14 2341  WBC 12.1*  HGB 14.3  PLT 154  CREATININE 1.04*   Estimated Creatinine Clearance: 49.7 mL/min (by C-G formula based on Cr of 1.04). No results for input(s): VANCOTROUGH, VANCOPEAK, VANCORANDOM, GENTTROUGH, GENTPEAK, GENTRANDOM, TOBRATROUGH, TOBRAPEAK, TOBRARND, AMIKACINPEAK, AMIKACINTROU, AMIKACIN in the last 72 hours.   Microbiology: No results found for this or any previous visit (from the past 720 hour(s)).  Medical History: Past Medical History  Diagnosis Date  . CHF (congestive heart failure) (HCC)   . Thyroid disease   . Hypertension   . Arthritis   . Arterial stent thrombosis (HCC)     Medications:  Infusions:  . sodium chloride     Assessment: 73 yof was at work, family and friends say she's not acting right, starting broad spectrum for sepsis r/o. WBC 12.1, temp 101.7, HR 92, LA 1.4.  Vd 47.1 L, Ke 0.046 hr-1, T1/2 15.1 hr  Goal of Therapy:  Vancomycin trough level 15-20 mcg/ml  Plan:  Expected duration 7 days with resolution of temperature and/or normalization of WBC. Zosyn 3.375 gm IV Q8H EI and vancomycin 1 gm IV Q18H predicted trough 17 mcg/mL, will continue to follow labs/levels and adjust dose as needed to maintain trough 15 to 20 mcg/mL.  12/28/14, Pharm.D. Clinical Pharmacist 12/27/2014,1:48 AM

## 2014-12-27 NOTE — ED Notes (Signed)
Patient transported to CT 

## 2014-12-27 NOTE — H&P (Signed)
[facility]   PATIENT NAME: Tammy Obrien    MR#:  [id number]  DATE OF BIRTH:  [birth year]  DATE OF ADMISSION:  12/26/2014  PRIMARY CARE PHYSICIAN: Leim Fabry, MD   REQUESTING/REFERRING PHYSICIAN: Scotty Court, MD  CHIEF COMPLAINT:   Chief Complaint  Patient presents with  . Altered Mental Status    HISTORY OF PRESENT ILLNESS:  Sunday Tammy Obrien  is a 74 y.o. female who presents with acute encephalopathy. Patient is here with family members who contribute to the history of present illness. Patient family state that the patient was found this evening at her home after having some amount of confusion during a telephone conversation with her daughter. Her other daughter subsequently went to her house to check on her and found her outside of her garage trying to open it with her car key. On the phone conversation with her first daughter she had mentioned that her car wouldn't start, but when her family members check this after going to her house the car started without problem. At that time patient's daughter states that the patient was oriented only to person. She was brought to the ED for evaluation. When she got here she was found to be febrile and mildly tachycardic. On ED provider's evaluation she was oriented to person and place but not to time or circumstance. Given her vital signs sepsis workup was initiated, however failed to elucidate any source of infection. Hospitalists were called for admission for acute encephalopathy.  PAST MEDICAL HISTORY:   Past Medical History  Diagnosis Date  . CHF (congestive heart failure) (HCC)   . Thyroid disease   . Hypertension   . RA (rheumatoid arthritis) (HCC)   . Arterial stent thrombosis (HCC)   . CAD (coronary artery disease)   . HLD (hyperlipidemia)   . Type 2 diabetes mellitus (HCC)     PAST SURGICAL HISTORY:   Past Surgical History  Procedure Laterality Date  . Abdominal  hysterectomy    . Cataract extraction    . Cardiac surgery    . Blepharoplasty      SOCIAL HISTORY:   Social History  Substance Use Topics  . Smoking status: Never Smoker   . Smokeless tobacco: Never Used  . Alcohol Use: No    FAMILY HISTORY:   Family History  Problem Relation Age of Onset  . Hypertension Mother   . Cancer Mother   . Glaucoma Mother   . Cancer Father   . Heart attack Sister   . Diabetes Brother   . Stroke Brother   . Glaucoma Brother     DRUG ALLERGIES:   Allergies  Allergen Reactions  . Enalaprilat   . Flexeril [Cyclobenzaprine] Itching  . Verapamil Itching  . Zocor [Simvastatin] Itching    MEDICATIONS AT HOME:   Prior to Admission medications   Medication Sig Start Date End Date Taking? Authorizing Provider  aspirin EC 81 MG tablet Take 1 tablet by mouth daily. 10/09/13  Yes Historical Provider, MD  Multiple Vitamins-Minerals (MULTIVITAMIN ADULT PO) Take 1 tablet by mouth daily. 04/02/07  Yes Historical Provider, MD  atorvastatin (LIPITOR) 10 MG tablet Take 1 tablet by mouth daily. 10/06/14   Historical Provider, MD  carvedilol (COREG) 3.125 MG tablet Take 1 tablet by mouth 3 (three) times daily. 11/05/14   Historical Provider, MD  Cholecalciferol (VITAMIN D3) 5000 UNITS TABS Take 1 tablet by mouth daily.    Historical Provider, MD  folic acid (FOLVITE) 1 MG tablet  Take 1 tablet by mouth daily. 10/06/14   Historical Provider, MD  methotrexate (RHEUMATREX) 2.5 MG tablet Take 1 tablet by mouth daily. 12/05/14   Historical Provider, MD  potassium chloride SA (K-DUR,KLOR-CON) 20 MEQ tablet Take 2 tablets by mouth daily. 12/22/14   Historical Provider, MD  predniSONE (DELTASONE) 1 MG tablet Take 2 tablets by mouth daily. 11/21/14   Historical Provider, MD  traMADol (ULTRAM) 50 MG tablet Take 1 tablet by mouth 2 (two) times daily. 10/06/14   Historical Provider, MD  triamcinolone ointment (KENALOG) 0.1 % Apply 1 application topically daily as needed. 12/23/14    Historical Provider, MD  triamterene-hydrochlorothiazide (DYAZIDE) 37.5-25 MG capsule Take 1 capsule by mouth daily. 11/05/14   Historical Provider, MD  ZETIA 10 MG tablet Take 1 tablet by mouth daily. 09/18/14   Historical Provider, MD    REVIEW OF SYSTEMS:  Review of Systems  Constitutional: Positive for fever. Negative for chills, weight loss and malaise/fatigue.  HENT: Negative for ear pain, hearing loss and tinnitus.   Eyes: Negative for blurred vision, double vision, pain and redness.  Respiratory: Negative for cough, hemoptysis and shortness of breath.   Cardiovascular: Negative for chest pain, palpitations, orthopnea and leg swelling.  Gastrointestinal: Negative for nausea, vomiting, abdominal pain, diarrhea and constipation.  Genitourinary: Negative for dysuria, frequency and hematuria.  Musculoskeletal: Negative for back pain, joint pain and neck pain.  Skin:       No acne, rash, or lesions  Neurological: Negative for dizziness, tremors, focal weakness and weakness.       Confusion  Endo/Heme/Allergies: Negative for polydipsia. Does not bruise/bleed easily.  Psychiatric/Behavioral: Negative for depression. The patient is not nervous/anxious and does not have insomnia.      VITAL SIGNS:   Filed Vitals:   12/27/14 0238 12/27/14 0245 12/27/14 0300 12/27/14 0330  BP:  157/82 149/86 173/79  Pulse:  85 84 81  Temp: 98.7 F (37.1 C)     TempSrc: Oral     Resp:  19 14 18   Height:      Weight:      SpO2:  98% 99% 97%   Wt Readings from Last 3 Encounters:  12/26/14 79.379 kg (175 lb)    PHYSICAL EXAMINATION:  Physical Exam  Vitals reviewed. Constitutional: She is oriented to person, place, and time. She appears well-developed and well-nourished. No distress.  HENT:  Head: Normocephalic and atraumatic.  Mouth/Throat: Oropharynx is clear and moist.  Eyes: Conjunctivae and EOM are normal. Pupils are equal, round, and reactive to light. No scleral icterus.  Neck: Normal  range of motion. Neck supple. No JVD present. No thyromegaly present.  Cardiovascular: Normal rate, regular rhythm and intact distal pulses.  Exam reveals no gallop and no friction rub.   No murmur heard. Respiratory: Effort normal and breath sounds normal. No respiratory distress. She has no wheezes. She has no rales.  GI: Soft. Bowel sounds are normal. She exhibits no distension. There is no tenderness.  Musculoskeletal: Normal range of motion. She exhibits no edema.  No arthritis, no gout  Lymphadenopathy:    She has no cervical adenopathy.  Neurological: She is alert and oriented to person, place, and time. No cranial nerve deficit.  Neurologic: Cranial nerves II-XII intact, Sensation intact to light touch/pinprick, 5/5 strength in all extremities, no dysarthria, no aphasia, no dysphagia, memory intact though patient does demonstrate some slowed response time when answering orientation questions, and she also has some small amount of perseveration when speaking with  her daughter in identifying the location of the clinics where she was recently seen, see history of present illness for details, finger to nose testing showed no abnormality, no pronator drift, DTR intact, Babinski sign not present.  Skin: Skin is warm and dry. No rash noted. No erythema.  Psychiatric: She has a normal mood and affect. Her behavior is normal. Judgment and thought content normal.    LABORATORY PANEL:   CBC  Recent Labs Lab 12/26/14 2341  WBC 12.1*  HGB 14.3  HCT 43.6  PLT 154   ------------------------------------------------------------------------------------------------------------------  Chemistries   Recent Labs Lab 12/26/14 2341  NA 134*  K 3.5  CL 97*  CO2 27  GLUCOSE 121*  BUN 17  CREATININE 1.04*  CALCIUM 9.3  AST 23  ALT 17  ALKPHOS 95  BILITOT 0.7   ------------------------------------------------------------------------------------------------------------------  Cardiac  Enzymes  Recent Labs Lab 12/26/14 2341  TROPONINI <0.03   ------------------------------------------------------------------------------------------------------------------  RADIOLOGY:  Ct Head Wo Contrast  12/27/2014  CLINICAL DATA:  Altered mental status, acute onset. Initial encounter. EXAM: CT HEAD WITHOUT CONTRAST TECHNIQUE: Contiguous axial images were obtained from the base of the skull through the vertex without intravenous contrast. COMPARISON:  None. FINDINGS: There is no evidence of acute infarction, mass lesion, or intra- or extra-axial hemorrhage on CT. The posterior fossa, including the cerebellum, brainstem and fourth ventricle, is within normal limits. The third and lateral ventricles, and basal ganglia are unremarkable in appearance. The cerebral hemispheres are symmetric in appearance, with normal gray-white differentiation. No mass effect or midline shift is seen. There is no evidence of fracture; visualized osseous structures are unremarkable in appearance. The visualized portions of the orbits are within normal limits. The paranasal sinuses and mastoid air cells are well-aerated. No significant soft tissue abnormalities are seen. IMPRESSION: Unremarkable noncontrast CT of the head. Electronically Signed   By: Roanna Raider M.D.   On: 12/27/2014 00:39   Dg Chest Port 1 View  12/26/2014  CLINICAL DATA:  Altered mental status EXAM: PORTABLE CHEST 1 VIEW COMPARISON:  None. FINDINGS: Lungs are clear. Heart size and pulmonary vascularity are normal. No adenopathy. No bone lesions. IMPRESSION: No edema or consolidation. Electronically Signed   By: Bretta Bang III M.D.   On: 12/26/2014 23:59    EKG:   Orders placed or performed during the hospital encounter of 12/26/14  . EKG 12-Lead  . EKG 12-Lead    IMPRESSION AND PLAN:  Principal Problem:   Acute encephalopathy - unclear etiology at this time. Patient came initially febrile and tachycardic and underwent sepsis  workup. However preliminary workup did not identify any source of infection with a clean chest x-ray, UA not suspicious for infection, no other complaints of infectious symptomology by the patient. After further interview, seems like the patient may potentially have had a small stroke. Her symptoms of confusion have improved somewhat since she came to the ED. Initially family stated that she was unable to answer clearly where she was or what was going on, and was only oriented to person. In the ED, provider mention that she was oriented to person and place but not to time or circumstance. On this writer's evaluation she was completely oriented though slow to respond to most questions. We will order neurology consult, we'll also check a methotrexate level, and proceed with a stroke workup as well including MRI and MRA, echocardiogram, and standard stroke labs. Active Problems:   Type 2 diabetes mellitus (HCC) - not currently on any anti-glycemic medication.  We'll order sliding scale coverage with corresponding fingerstick glucose checks and a carb modified diet.   HTN (hypertension) - somewhat elevated, however given the strong suspicion for stroke rule out for some permissive hypertension at this time   CAD (coronary artery disease) - chronic stable disease, continue home medications   RA (rheumatoid arthritis) (HCC) - continue home meds including methotrexate for now until we get a methotrexate level back.   HLD (hyperlipidemia) - continue home dose statin  All the records are reviewed and case discussed with ED provider. Management plans discussed with the patient and/or family.  DVT PROPHYLAXIS: SubQ lovenox  ADMISSION STATUS: Inpatient  CODE STATUS: Full  TOTAL TIME TAKING CARE OF THIS PATIENT: 45 minutes.    Marquel Spoto FIELDING 12/27/2014, 3:41 AM  [facility]  Office  [phone]  CC: Primary care physician; Leim Fabry, MD

## 2014-12-28 DIAGNOSIS — N390 Urinary tract infection, site not specified: Secondary | ICD-10-CM | POA: Diagnosis not present

## 2014-12-28 LAB — GLUCOSE, CAPILLARY
GLUCOSE-CAPILLARY: 112 mg/dL — AB (ref 65–99)
GLUCOSE-CAPILLARY: 131 mg/dL — AB (ref 65–99)
GLUCOSE-CAPILLARY: 91 mg/dL (ref 65–99)
Glucose-Capillary: 160 mg/dL — ABNORMAL HIGH (ref 65–99)

## 2014-12-28 LAB — RAPID HIV SCREEN (HIV 1/2 AB+AG)
HIV 1/2 Antibodies: NONREACTIVE
HIV-1 P24 ANTIGEN - HIV24: NONREACTIVE

## 2014-12-28 MED ORDER — CEFUROXIME AXETIL 250 MG PO TABS
250.0000 mg | ORAL_TABLET | Freq: Two times a day (BID) | ORAL | Status: DC
  Administered 2014-12-28 – 2014-12-29 (×3): 250 mg via ORAL
  Filled 2014-12-28 (×5): qty 1

## 2014-12-28 MED ORDER — DOXYCYCLINE HYCLATE 100 MG PO TABS
100.0000 mg | ORAL_TABLET | Freq: Two times a day (BID) | ORAL | Status: DC
  Administered 2014-12-28: 10:00:00 100 mg via ORAL
  Filled 2014-12-28: qty 1

## 2014-12-28 MED ORDER — TRAMADOL HCL 50 MG PO TABS
50.0000 mg | ORAL_TABLET | Freq: Four times a day (QID) | ORAL | Status: DC | PRN
  Administered 2014-12-28: 50 mg via ORAL
  Filled 2014-12-28: qty 1

## 2014-12-28 NOTE — Progress Notes (Signed)
Neurology note  S:  No complaints, back at baseline from multiple family members at bedside.  Pt no complaints  ROS neg x 8 systems  O:   98.6    153/106     96    20 Overweight, NAD Normocephalic, oropharynx clear Supple, no LAD CTA B, no wheezing RRR, no murmurs No C/C/E  MRI personally reviewed by me and has trace white matter changes  A/P: 1.  Encephalopathy-  Resolved, this is likely due to underlying infection that is not a CSF infection and most likely urinary given labs -  Continue antibiotics -  Will sign off, please call with questions

## 2014-12-28 NOTE — Progress Notes (Signed)
[facility] - [place] at [facility]   PATIENT NAME: Tammy Obrien    MR#:  [id number]  DATE OF BIRTH:  1940/06/05  SUBJECTIVE:  Patient appears to be at her baseline this morning. Family is at bedside who agrees. Patient did have a fever this morning.   REVIEW OF SYSTEMS:    Review of Systems  Constitutional: Positive for fever. Negative for chills and malaise/fatigue.  HENT: Negative for sore throat.   Eyes: Negative for blurred vision.  Respiratory: Negative for cough, hemoptysis, shortness of breath and wheezing.   Cardiovascular: Negative for chest pain, palpitations and leg swelling.  Gastrointestinal: Negative for nausea, vomiting, abdominal pain, diarrhea and blood in stool.  Genitourinary: Negative for dysuria.  Musculoskeletal: Negative for back pain.  Neurological: Negative for dizziness, tremors, focal weakness and headaches.  Endo/Heme/Allergies: Does not bruise/bleed easily.  Psychiatric/Behavioral: Negative for depression and suicidal ideas.    Tolerating Diet: Yes      DRUG ALLERGIES:   Allergies  Allergen Reactions  . Enalaprilat   . Flexeril [Cyclobenzaprine] Itching  . Verapamil Itching  . Zocor [Simvastatin] Itching    VITALS:  Blood pressure 153/106, pulse 96, temperature 98.6 F (37 C), temperature source Oral, resp. rate 20, height 5\' 6"  (1.676 m), weight 73.301 kg (161 lb 9.6 oz), SpO2 100 %.  PHYSICAL EXAMINATION:   Physical Exam  Constitutional: She is oriented to person, place, and time and well-developed, well-nourished, and in no distress. No distress.  HENT:  Head: Normocephalic.  Eyes: No scleral icterus.  Neck: Normal range of motion. Neck supple. No JVD present. No tracheal deviation present.  Cardiovascular: Normal rate, regular rhythm and normal heart sounds.  Exam reveals no gallop and no friction rub.   No murmur heard. Pulmonary/Chest: Effort normal and breath sounds normal. No respiratory  distress. She has no wheezes. She has no rales. She exhibits no tenderness.  Abdominal: Soft. Bowel sounds are normal. She exhibits no distension and no mass. There is no tenderness. There is no rebound and no guarding.  Musculoskeletal: Normal range of motion. She exhibits no edema.  Neurological: She is alert and oriented to person, place, and time.  Skin: Skin is warm. No rash noted. No erythema.  Psychiatric: Affect and judgment normal.      LABORATORY PANEL:   CBC  Recent Labs Lab 12/27/14 0621  WBC 9.4  HGB 12.4  HCT 38.4  PLT 135*   ------------------------------------------------------------------------------------------------------------------  Chemistries   Recent Labs Lab 12/26/14 2341 12/27/14 0621  NA 134* 137  K 3.5 3.5  CL 97* 102  CO2 27 29  GLUCOSE 121* 164*  BUN 17 13  CREATININE 1.04* 1.04*  CALCIUM 9.3 8.4*  AST 23  --   ALT 17  --   ALKPHOS 95  --   BILITOT 0.7  --    ------------------------------------------------------------------------------------------------------------------  Cardiac Enzymes  Recent Labs Lab 12/26/14 2341  TROPONINI <0.03   ------------------------------------------------------------------------------------------------------------------  RADIOLOGY:  Ct Head Wo Contrast  12/27/2014  CLINICAL DATA:  Altered mental status, acute onset. Initial encounter. EXAM: CT HEAD WITHOUT CONTRAST TECHNIQUE: Contiguous axial images were obtained from the base of the skull through the vertex without intravenous contrast. COMPARISON:  None. FINDINGS: There is no evidence of acute infarction, mass lesion, or intra- or extra-axial hemorrhage on CT. The posterior fossa, including the cerebellum, brainstem and fourth ventricle, is within normal limits. The third and lateral ventricles, and basal ganglia are unremarkable in appearance. The cerebral hemispheres are symmetric  in appearance, with normal gray-white differentiation. No mass  effect or midline shift is seen. There is no evidence of fracture; visualized osseous structures are unremarkable in appearance. The visualized portions of the orbits are within normal limits. The paranasal sinuses and mastoid air cells are well-aerated. No significant soft tissue abnormalities are seen. IMPRESSION: Unremarkable noncontrast CT of the head. Electronically Signed   By: Roanna Raider M.D.   On: 12/27/2014 00:39   Mr Brain Wo Contrast  12/27/2014  CLINICAL DATA:  Acute encephalopathy. Confusion. Patient is febrile, possible sepsis. History of diabetes. History of hyperlipidemia and hypertension. EXAM: MRI HEAD WITHOUT CONTRAST MRA HEAD WITHOUT CONTRAST TECHNIQUE: Multiplanar, multiecho pulse sequences of the brain and surrounding structures were obtained without intravenous contrast. Angiographic images of the head were obtained using MRA technique without contrast. COMPARISON:  CT head earlier today. FINDINGS: The patient was unable to remain motionless for the exam. Small or subtle lesions could be overlooked. MRI HEAD FINDINGS No visible restricted diffusion. No hemorrhage, mass lesion, hydrocephalus, or extra-axial fluid. Generalized atrophy. Mild subcortical and periventricular T2 and FLAIR hyperintensities, likely chronic microvascular ischemic change. Pituitary and cerebellar tonsils unremarkable. Cervical spondylosis with suspected disc osteophyte complex at C3-4. No visible foci of chronic hemorrhage. Flow voids are maintained. Extracranial soft tissues grossly unremarkable. Compared with prior CT, good general agreement. MRA HEAD FINDINGS Motion degraded exam. BILATERAL cavernous carotid narrowing inferiorly is felt to be artifactual, but focal stenoses are not excluded. Supraclinoid ICAs widely patent. Basilar artery widely patent with vertebrals codominant. Unremarkable proximal anterior and middle cerebral artery on the RIGHT. Mildly irregular proximal LEFT middle cerebral artery.  Severely diseased LEFT A1 ACA. LEFT PCA demonstrates a mixed fetal/native origin from the basilar, with focal narrowing at the proximal LEFT P2 segment potentially flow reducing. Mild irregularity of the distal MCA and PCA branches suggesting intracranial atherosclerotic change. No intracranial aneurysm. IMPRESSION: Motion degraded MRI brain exam demonstrating atrophy and small vessel disease without definite acute intracranial findings. Motion degraded MRA with suspected artifactual narrowing of the cavernous carotid arteries bilaterally. Moderately diseased LEFT anterior cerebral artery proximally. Electronically Signed   By: Elsie Stain M.D.   On: 12/27/2014 12:19   US Carotid Bilateral  12/27/2014  CLINICAL DATA:  Acute encephalopathy. Hypertension, stroke, diabetes. EXAM: BILATERAL CAROTID DUPLEX ULTRASOUND TECHNIQUE: Wallace Cullens scale imaging, color Doppler and duplex ultrasound was performed of bilateral carotid and vertebral arteries in the neck. COMPARISON:  None. REVIEW OF SYSTEMS: Quantification of carotid stenosis is based on velocity parameters that correlate the residual internal carotid diameter with NASCET-based stenosis levels, using the diameter of the distal internal carotid lumen as the denominator for stenosis measurement. The following velocity measurements were obtained: PEAK SYSTOLIC/END DIASTOLIC RIGHT ICA:                     103/18cm/sec CCA:                     118/17cm/sec SYSTOLIC ICA/CCA RATIO:  0.88 DIASTOLIC ICA/CCA RATIO: 1.07 ECA:                     82cm/sec LEFT ICA:                     83 /11cm/sec CCA:                     116/12cm/sec SYSTOLIC ICA/CCA RATIO:  0.71 DIASTOLIC ICA/CCA RATIO: 1.46 ECA:  83cm/sec FINDINGS: RIGHT CAROTID ARTERY: Eccentric partially calcified plaque in the bulb extending into the proximal ICA, without high-grade stenosis. Normal waveforms and color Doppler signal. RIGHT VERTEBRAL ARTERY:  Normal flow direction and waveform. LEFT  CAROTID ARTERY: Intimal thickening through the common carotid artery. Eccentric noncalcified plaque in the bulb and proximal ICA. No high-grade stenosis. Normal waveforms and color Doppler signal. LEFT VERTEBRAL ARTERY: Normal flow direction and waveform. IMPRESSION: 1. Bilateral carotid bifurcation and proximal ICA plaque resulting in less than 50% diameter stenosis. The exam does not exclude plaque ulceration or embolization. Continued surveillance recommended. Electronically Signed   By: Corlis Leak M.D.   On: 12/27/2014 11:13   Dg Chest Port 1 View  12/26/2014  CLINICAL DATA:  Altered mental status EXAM: PORTABLE CHEST 1 VIEW COMPARISON:  None. FINDINGS: Lungs are clear. Heart size and pulmonary vascularity are normal. No adenopathy. No bone lesions. IMPRESSION: No edema or consolidation. Electronically Signed   By: Bretta Bang III M.D.   On: 12/26/2014 23:59   Mr Palma Holter  12/27/2014  CLINICAL DATA:  Acute encephalopathy. Confusion. Patient is febrile, possible sepsis. History of diabetes. History of hyperlipidemia and hypertension. EXAM: MRI HEAD WITHOUT CONTRAST MRA HEAD WITHOUT CONTRAST TECHNIQUE: Multiplanar, multiecho pulse sequences of the brain and surrounding structures were obtained without intravenous contrast. Angiographic images of the head were obtained using MRA technique without contrast. COMPARISON:  CT head earlier today. FINDINGS: The patient was unable to remain motionless for the exam. Small or subtle lesions could be overlooked. MRI HEAD FINDINGS No visible restricted diffusion. No hemorrhage, mass lesion, hydrocephalus, or extra-axial fluid. Generalized atrophy. Mild subcortical and periventricular T2 and FLAIR hyperintensities, likely chronic microvascular ischemic change. Pituitary and cerebellar tonsils unremarkable. Cervical spondylosis with suspected disc osteophyte complex at C3-4. No visible foci of chronic hemorrhage. Flow voids are maintained. Extracranial soft  tissues grossly unremarkable. Compared with prior CT, good general agreement. MRA HEAD FINDINGS Motion degraded exam. BILATERAL cavernous carotid narrowing inferiorly is felt to be artifactual, but focal stenoses are not excluded. Supraclinoid ICAs widely patent. Basilar artery widely patent with vertebrals codominant. Unremarkable proximal anterior and middle cerebral artery on the RIGHT. Mildly irregular proximal LEFT middle cerebral artery. Severely diseased LEFT A1 ACA. LEFT PCA demonstrates a mixed fetal/native origin from the basilar, with focal narrowing at the proximal LEFT P2 segment potentially flow reducing. Mild irregularity of the distal MCA and PCA branches suggesting intracranial atherosclerotic change. No intracranial aneurysm. IMPRESSION: Motion degraded MRI brain exam demonstrating atrophy and small vessel disease without definite acute intracranial findings. Motion degraded MRA with suspected artifactual narrowing of the cavernous carotid arteries bilaterally. Moderately diseased LEFT anterior cerebral artery proximally. Electronically Signed   By: Elsie Stain M.D.   On: 12/27/2014 12:19     ASSESSMENT AND PLAN:   This is a 74 year old female who presented with acute encephalopathy.  1. Acute encephalopathy: I suspect this is related to her fever and possibly urinary tract infection. Her MRI was negative for stroke.  Her symptoms have resolved and now she is at her baseline. HIV and RPR are pending. Patient does not need lumbar puncture as her encephalopathy has improved and she has no signs of meningitis. 2. Essential hypertension: Continue Coreg  3. Hyperlipidemia: LDL is at goal. Continue atorvastatin  4. Diabetes type 2: Continue ADA diet.  5. Rheumatoid arthritis: Continue methotrexate and prednisone  6. Fever: Patient has been ruled out for sepsis. I suspect her fever is actually viral in etiology. Her  blood cultures are negative to date. I will discontinue antibiotics  with the exception of Ceftin to cover urinary tract infection. She will need to be observed 24 hours and hopefully will be afebrile.  Management plans discussed with the patient and family she is in agreement.  CODE STATUS: FULL  TOTAL TIME TAKING CARE OF THIS PATIENT: 30 minutes.     POSSIBLE D/C tomorrow, DEPENDING ON CLINICAL CONDITION.   Saket Hellstrom M.D on 12/28/2014 at 11:09 AM  Between 7am to 6pm - Pager - [phone] After 6pm go to [email or web address] - password [email or web address]  [facility]  Office  [phone]  CC: Primary care physician; Leim Fabry, MD  Note: This dictation was prepared with Dragon dictation along with smaller phrase technology. Any transcriptional errors that result from this process are unintentional.

## 2014-12-28 NOTE — Progress Notes (Signed)
Pt A&O; remains on Moderate Fall Risks, stand by assist oob this shift; oob to bsc with 2 episodes of loose stools to date this shift, Dr Juliene Pina made aware on am rounds verbally in case 3rd episode occurs d/t ? Cdiff, Dr Juliene Pina verbalized that the loose stool was due to antibiotics; pt febrile x 1 this shift with PRN Tylenol given with relief; pt has small blister at her sacral area, pt verbalized that it was a side effect of PO Methotrexate, small Allevyn pad applied for protection; pt hoping to be discharged home tomorrow

## 2014-12-28 NOTE — Plan of Care (Signed)
Problem: Discharge Progression Outcomes Goal: Other Discharge Outcomes/Goals Pt verbalized that she wants to go home tomorrow; Neuro Cons note by Dr Katrinka Blazing no CVA fever d/t encephalopathy

## 2014-12-28 NOTE — Progress Notes (Signed)
Spoke to Dr. Joneen Roach about pt having temperature all shift and was medicated with tylenol and blood pressure elevated this AM.  Family was concerned pt did not get blood pressure medication yesterday but did x 2. Asked MD if it was okay to give coreg early this AM and that was okay to do. Tylenol given again this AM. Temp did come down to 100.3 but it was during bath when pt had a bowel incontinent episode.

## 2014-12-29 LAB — ROCKY MTN SPOTTED FVR ABS PNL(IGG+IGM)
RMSF IGG: NEGATIVE
RMSF IGM: 0.12 {index} (ref 0.00–0.89)

## 2014-12-29 LAB — GLUCOSE, CAPILLARY
GLUCOSE-CAPILLARY: 141 mg/dL — AB (ref 65–99)
GLUCOSE-CAPILLARY: 85 mg/dL (ref 65–99)

## 2014-12-29 LAB — URINE CULTURE

## 2014-12-29 LAB — RPR: RPR Ser Ql: NONREACTIVE

## 2014-12-29 MED ORDER — CEFUROXIME AXETIL 250 MG PO TABS: 250.0000 mg | ORAL_TABLET | Freq: Two times a day (BID) | ORAL | Status: DC

## 2014-12-29 NOTE — Discharge Summary (Signed)
[facility] - [place] at [facility]   PATIENT NAME: Tammy Obrien    MR#:  [id number]  DATE OF BIRTH:  04/25/1940  DATE OF ADMISSION:  12/26/2014 ADMITTING PHYSICIAN: Oralia Manis, MD  DATE OF DISCHARGE: 12/29/2014    PRIMARY CARE PHYSICIAN: ALDRIDGE,BARBARA, MD    ADMISSION DIAGNOSIS:  Acute encephalopathy [G93.40] Fever, unspecified fever cause [R50.9]  DISCHARGE DIAGNOSIS:  Principal Problem:   Acute encephalopathy Active Problems:   Type 2 diabetes mellitus (HCC)   HTN (hypertension)   HLD (hyperlipidemia)   CAD (coronary artery disease)   RA (rheumatoid arthritis) (HCC)   SECONDARY DIAGNOSIS:   Past Medical History  Diagnosis Date  . CHF (congestive heart failure) (HCC)   . Thyroid disease   . Hypertension   . RA (rheumatoid arthritis) (HCC)   . Arterial stent thrombosis (HCC)   . CAD (coronary artery disease)   . HLD (hyperlipidemia)   . Type 2 diabetes mellitus [facility])     HOSPITAL COURSE:  This is a 74 year old female who presented with acute encephalopathy.  1. Acute encephalopathy: I suspect this is related to her fever and possibly urinary tract infection. Her MRI was negative for stroke. Her symptoms have resolved and now she is at her baseline. HIV and RPR are negative. Patient does not need lumbar puncture as her encephalopathy has improved and she has no signs of meningitis. 2. Essential hypertension: Continue Coreg  3. Hyperlipidemia: LDL is at goal. Continue atorvastatin  4. Diabetes type 2: Continue ADA diet.  5. Rheumatoid arthritis: Continue methotrexate and prednisone  6. Fever: Patient has been ruled out for sepsis. I suspect her fever is actually viral in etiology. Her blood cultures are negative to date. I will discontinue antibiotics with the exception of Ceftin to cover urinary tract infection. If she continues to have fever then she will likely need a whole body CT scan to evaluate for occult  tumor.  DISCHARGE CONDITIONS AND DIET:  Patient is being discharged home in stable condition on a regular diet  CONSULTS OBTAINED:  Treatment Team:  Pauletta Browns, MD  DRUG ALLERGIES:   Allergies  Allergen Reactions  . Enalaprilat   . Flexeril [Cyclobenzaprine] Itching  . Verapamil Itching  . Zocor [Simvastatin] Itching    DISCHARGE MEDICATIONS:   Current Discharge Medication List    START taking these medications   Details  cefUROXime (CEFTIN) 250 MG tablet Take 1 tablet (250 mg total) by mouth 2 (two) times daily with a meal. Qty: 10 tablet, Refills: 0      CONTINUE these medications which have NOT CHANGED   Details  aspirin EC 81 MG tablet Take 1 tablet by mouth daily.    Multiple Vitamins-Minerals (MULTIVITAMIN ADULT PO) Take 1 tablet by mouth daily.    atorvastatin (LIPITOR) 10 MG tablet Take 1 tablet by mouth daily.    carvedilol (COREG) 3.125 MG tablet Take 2 tablets by mouth 2 (two) times daily.     Cholecalciferol (VITAMIN D3) 5000 UNITS TABS Take 1 tablet by mouth daily.    folic acid (FOLVITE) 1 MG tablet Take 1 tablet by mouth daily.    methotrexate (RHEUMATREX) 2.5 MG tablet Take 1 tablet by mouth daily.    potassium chloride SA (K-DUR,KLOR-CON) 20 MEQ tablet Take 2 tablets by mouth daily.    predniSONE (DELTASONE) 1 MG tablet Take 2 tablets by mouth daily.    traMADol (ULTRAM) 50 MG tablet Take 1 tablet by mouth 2 (two) times daily.  triamcinolone ointment (KENALOG) 0.1 % Apply 1 application topically daily as needed.    triamterene-hydrochlorothiazide (DYAZIDE) 37.5-25 MG capsule Take 1 capsule by mouth daily.    ZETIA 10 MG tablet Take 1 tablet by mouth daily.              Today   CHIEF COMPLAINT:  Patient is doing well this morning. She had a low-grade fever. She is back to her baseline with her mental status. She denies nausea, abdominal pain or back pain.   VITAL SIGNS:  Blood pressure 151/70, pulse 88, temperature 99 F  (37.2 C), temperature source Oral, resp. rate 18, height 5\' 6"  (1.676 m), weight 73.301 kg (161 lb 9.6 oz), SpO2 98 %.   REVIEW OF SYSTEMS:  Review of Systems  Constitutional: Positive for fever. Negative for chills and malaise/fatigue.  HENT: Negative for sore throat.   Eyes: Negative for blurred vision.  Respiratory: Negative for cough, hemoptysis, shortness of breath and wheezing.   Cardiovascular: Negative for chest pain, palpitations and leg swelling.  Gastrointestinal: Negative for nausea, vomiting, abdominal pain, diarrhea and blood in stool.  Genitourinary: Negative for dysuria.  Musculoskeletal: Negative for back pain.  Neurological: Negative for dizziness, tremors and headaches.  Endo/Heme/Allergies: Does not bruise/bleed easily.     PHYSICAL EXAMINATION:  GENERAL:  74 y.o.-year-old patient lying in the bed with no acute distress.  NECK:  Supple, no jugular venous distention. No thyroid enlargement, no tenderness.  LUNGS: Normal breath sounds bilaterally, no wheezing, rales,rhonchi  No use of accessory muscles of respiration.  CARDIOVASCULAR: S1, S2 normal. No murmurs, rubs, or gallops.  ABDOMEN: Soft, non-tender, non-distended. Bowel sounds present. No organomegaly or mass.  EXTREMITIES: No pedal edema, cyanosis, or clubbing.  PSYCHIATRIC: The patient is alert and oriented x 3.  SKIN: No obvious rash, lesion, or ulcer.   DATA REVIEW:   CBC  Recent Labs Lab 12/27/14 0621  WBC 9.4  HGB 12.4  HCT 38.4  PLT 135*    Chemistries   Recent Labs Lab 12/26/14 2341 12/27/14 0621  NA 134* 137  K 3.5 3.5  CL 97* 102  CO2 27 29  GLUCOSE 121* 164*  BUN 17 13  CREATININE 1.04* 1.04*  CALCIUM 9.3 8.4*  AST 23  --   ALT 17  --   ALKPHOS 95  --   BILITOT 0.7  --     Cardiac Enzymes  Recent Labs Lab 12/26/14 2341  TROPONINI <0.03    Microbiology Results  @[email or web address] @  RADIOLOGY:  No results found.    Management plans discussed with the patient  and she is in agreement. Stable for discharge home  Patient should follow up with PCP in one week  CODE STATUS:     Code Status Orders        Start     Ordered   12/27/14 0446  Full code   Continuous     12/27/14 0445    Advance Directive Documentation        Most Recent Value   Type of Advance Directive  Healthcare Power of Attorney, Living will   Pre-existing out of facility DNR order (yellow form or pink MOST form)     "MOST" Form in Place?        TOTAL TIME TAKING CARE OF THIS PATIENT: 35 minutes.    Note: This dictation was prepared with Dragon dictation along with smaller phrase technology. Any transcriptional errors that result from this process are unintentional.  Devony Mcgrady M.D  on 12/29/2014 at 12:28 PM  Between 7am to 6pm - Pager - [phone] After 6pm go to [email or web address] - password [email or web address] [facility]  [place]  Hospitalists  Office  [phone]  CC: Primary care physician; Leim Fabry, MD

## 2014-12-29 NOTE — Progress Notes (Signed)
Patient discharged home via wheelchair by nursing staff. Terri Malerba S, RN  

## 2014-12-29 NOTE — Evaluation (Signed)
Occupational Therapy Evaluation Patient Details Name: Tammy Obrien MRN: [id number] DOB: 02-Jan-1941 Today's Date: 12/29/2014    History of Present Illness Patient is a 74 y.o. female admitted on 28 Oct. for acute encephalopathy. Patient has hx of DMII, HTN, hyperlipidemia, CAD, and RA.   Clinical Impression   This patient is a 74 year old female who came to [facility] with the above problems and Altered mental status. She appears to be back to previous level as per daughter. She has good strength and no sensory deficits. She demonstrates ability to use the bathroom and dress herself including tying shoes. No further Occupational Therapy needed.    Follow Up Recommendations  No OT follow up (home)    Equipment Recommendations       Recommendations for Other Services       Precautions / Restrictions Restrictions Weight Bearing Restrictions: No      Mobility Bed Mobility Overal bed mobility: Independent                Transfers Overall transfer level: Independent Equipment used:  (supervision)             General transfer comment: into the bathroom and back to bed.    Balance                                            ADL                                         General ADL Comments: Had been independent with ADL and mobility and works 2 days per week. She demonstrates ability to dress herself including tying shoes and to use the bathroom.     Vision     Perception     Praxis      Pertinent Vitals/Pain Pain Assessment: No/denies pain     Hand Dominance Right   Extremity/Trunk Assessment Upper Extremity Assessment Upper Extremity Assessment:  (B UE 5/5 - light touch, temp., and stereognosis are intact.)   Lower Extremity Assessment Lower Extremity Assessment: Defer to PT evaluation       Communication Communication Communication: No difficulties   Cognition Arousal/Alertness:  Awake/alert Behavior During Therapy: WFL for tasks assessed/performed Overall Cognitive Status: Within Functional Limits for tasks assessed Area of Impairment:  (Oriented x4)                   General Comments       Exercises       Shoulder Instructions      Home Living Family/patient expects to be discharged to:: Private residence Living Arrangements: Alone Available Help at Discharge: Family;Available PRN/intermittently Type of Home: House Home Access: Stairs to enter [employer] of Steps: 4 Entrance Stairs-Rails: Can reach both Home Layout: One level     Bathroom Shower/Tub: Producer, television/film/video: Standard Bathroom Accessibility: Yes   Home Equipment: None          Prior Functioning/Environment Level of Independence: Independent        Comments: works 2 days per week    OT Diagnosis: Altered mental status   OT Problem List:     OT Treatment/Interventions:      OT Goals(Current goals can be found in the care plan section)  Acute Rehab OT Goals Patient Stated Goal: To go home OT Goal Formulation: With patient/family  OT Frequency:     Barriers to D/C:            Co-evaluation              End of Session Equipment Utilized During Treatment: Gait belt  Activity Tolerance:   Patient left:     Time: 1040-1055 OT Time Calculation (min): 15 min Charges:  OT General Charges $OT Visit: 1 Procedure OT Evaluation $Initial OT Evaluation Tier I: 1 Procedure G-Codes:    Gwyndolyn Kaufman, MS/OTR/L  12/29/2014, 12:05 PM

## 2014-12-29 NOTE — Care Management Important Message (Signed)
Important Message  Patient Details  Name: Tammy Obrien MRN: [id number] Date of Birth: [birth year]   Medicare Important Message Given:  Yes-second notification given    Verita Schneiders Gulianna Hornsby 12/29/2014, 11:49 AM

## 2014-12-29 NOTE — Progress Notes (Signed)
Discharge instructions given and went over with patient and family at bedside. All questions answered. Patient to be discharged home with family. Bo Mcclintock, RN

## 2014-12-29 NOTE — Plan of Care (Addendum)
Problem: Discharge Progression Outcomes Goal: Pain controlled with appropriate interventions Outcome: Progressing Order obtained for Tramadol per patient request for headache. Pt said that the Tramadol helped her headache which she described as a nagging ache. Goal: Hemodynamically stable Outcome: Progressing Vital signs stable. Oral temp of 99.2. Reported by off going RN that patient had spiked a temp once during the day. Goal: Tolerating diet Outcome: Progressing Pt eating and drinking well Goal: Activity appropriate for discharge plan Outcome: Progressing Pt up with standby assist. Requested to have a shower. MD notified. Order given. Pt daughter assisted her with a shower Goal: Other Discharge Outcomes/Goals Outcome: Progressing Pt hoping to go home today. Stroke neuro assessment discontinued by on call MD (MRI was negative for CVA).

## 2014-12-30 LAB — METHOTREXATE: METHOTREXATE: NOT DETECTED umol/L (ref 0.02–5.00)

## 2015-01-01 LAB — CULTURE, BLOOD (ROUTINE X 2)
CULTURE: NO GROWTH
Culture: NO GROWTH
Culture: NO GROWTH
Culture: NO GROWTH

## 2015-01-02 ENCOUNTER — Encounter: Payer: Self-pay | Admitting: Sports Medicine

## 2015-01-02 ENCOUNTER — Ambulatory Visit (INDEPENDENT_AMBULATORY_CARE_PROVIDER_SITE_OTHER): Payer: Medicare Other | Admitting: Sports Medicine

## 2015-01-02 VITALS — BP 135/73 | HR 79

## 2015-01-02 DIAGNOSIS — M2040 Other hammer toe(s) (acquired), unspecified foot: Secondary | ICD-10-CM | POA: Diagnosis not present

## 2015-01-02 DIAGNOSIS — M79674 Pain in right toe(s): Secondary | ICD-10-CM | POA: Diagnosis not present

## 2015-01-02 DIAGNOSIS — L603 Nail dystrophy: Secondary | ICD-10-CM

## 2015-01-02 NOTE — Progress Notes (Signed)
Patient ID: Tammy Obrien, female   DOB: 18-Feb-1941, 74 y.o.   MRN: [id number] Subjective: Tammy Obrien is a 74 y.o. female patient seen today in office with complaint of discolored right 1st and 5th toe nails. Denies infection/trauma/injury. Patient has tried OTC Fungi-nail and Vinegar soaks with no improvement. Patient denies history of Neuropathy, or Vascular disease. Diabetes dx but not on meds.Patient has no other pedal complaints at this time.   Patient Active Problem List   Diagnosis Date Noted  . Acute encephalopathy 12/27/2014  . Type 2 diabetes mellitus (HCC) 12/27/2014  . HTN (hypertension) 12/27/2014  . HLD (hyperlipidemia) 12/27/2014  . CAD (coronary artery disease) 12/27/2014  . RA (rheumatoid arthritis) (HCC) 12/27/2014   Current Outpatient Prescriptions on File Prior to Visit  Medication Sig Dispense Refill  . aspirin EC 81 MG tablet Take 1 tablet by mouth daily.    Marland Kitchen atorvastatin (LIPITOR) 10 MG tablet Take 1 tablet by mouth daily.    . carvedilol (COREG) 3.125 MG tablet Take 2 tablets by mouth 2 (two) times daily.     . cefUROXime (CEFTIN) 250 MG tablet Take 1 tablet (250 mg total) by mouth 2 (two) times daily with a meal. 10 tablet 0  . Cholecalciferol (VITAMIN D3) 5000 UNITS TABS Take 1 tablet by mouth daily.    . folic acid (FOLVITE) 1 MG tablet Take 1 tablet by mouth daily.    . methotrexate (RHEUMATREX) 2.5 MG tablet Take 1 tablet by mouth daily.    . Multiple Vitamins-Minerals (MULTIVITAMIN ADULT PO) Take 1 tablet by mouth daily.    . potassium chloride SA (K-DUR,KLOR-CON) 20 MEQ tablet Take 2 tablets by mouth daily.    . predniSONE (DELTASONE) 1 MG tablet Take 2 tablets by mouth daily.    . traMADol (ULTRAM) 50 MG tablet Take 1 tablet by mouth 2 (two) times daily.    Marland Kitchen triamterene-hydrochlorothiazide (DYAZIDE) 37.5-25 MG capsule Take 1 capsule by mouth daily.    Marland Kitchen ZETIA 10 MG tablet Take 1 tablet by mouth daily.     No current facility-administered  medications on file prior to visit.     Allergies  Allergen Reactions  . Enalaprilat   . Flexeril [Cyclobenzaprine] Itching  . Verapamil Itching  . Zocor [Simvastatin] Itching    Objective: Physical Exam  General: Well developed, nourished, no acute distress, awake, alert and oriented x 3  Vascular: Dorsalis pedis artery 2/4 bilateral, Posterior tibial artery 2/4 bilateral, skin temperature warm to warm proximal to distal bilateral lower extremities, no varicosities, pedal hair present bilateral.  Neurological: Gross sensation present via light touch bilateral.   Dermatological: Skin is warm, dry, and supple bilateral, Right 1st and 5th toenails are discolored with mild subungal debris, all other nails are within normal limits, no webspace macerations present bilateral, no open lesions present bilateral, no callus/corns/hyperkeratotic tissue present bilateral. No signs of infection bilateral.  Musculoskeletal: Mild asymptomatic hammertoes 2-5 deformities noted bilateral. Muscular strength within normal limits without pain or limitation on range of motion. No pain with calf compression bilateral.  Assessment and Plan:  Problem List Items Addressed This Visit    None    Visit Diagnoses    Nail dystrophy    -  Primary    probable onychomycosis right hallux and 5th toenails    Relevant Orders    Fungus Culture with Smear    Pain in toe of right foot        Hammertoe, unspecified laterality  Asymptomatic 2-5 R>L      -Examined patient.  -Discussed treatment options for discolored dystrophic nails; risks, benefits, alternatives explained -Fungal culture obtained from right 1st and 5th toenails and sent to [facility] lab -Rx Kerydin via iassist until complete culture results are obtained -Recommend good supportive shoes and monitoring of hammertoes for changes or symptoms -Patient to return in 5 weeks for follow up evaluation/results of culture or sooner if symptoms  worsen.  Asencion Islam, DPM

## 2015-01-02 NOTE — Progress Notes (Deleted)
   Subjective:    Patient ID: Tammy Obrien, female    DOB: Aug 31, 1940, 74 y.o.   MRN: [id number]  HPI    Review of Systems  All other systems reviewed and are negative.      Objective:   Physical Exam        Assessment & Plan:

## 2015-01-07 ENCOUNTER — Telehealth: Payer: Self-pay | Admitting: *Deleted

## 2015-01-07 NOTE — Telephone Encounter (Signed)
Called and spoke with [employer] rep, Selena Batten and Spoke with patient Remmert). Patient was informed that because she has medicare and a secondary insurance when the rx was sent to the pharmacy; Medicare requires copay of $45 for this medication. Discussed with patient that the company [employer] will work with the patient on her refills and consider sending rx to another pharmacy (mail order) where the copay will be cheaper for her subsequent refills. Patient expressed understanding and stated that she will inform her daughter of this as well. Patient is to follow up with me in the office within 4 weeks to review culture results and to see how she is doing with the medication. -Dr. Marylene Land

## 2015-01-07 NOTE — Telephone Encounter (Signed)
Patient and pt's daughter presents to the office at 4:50pm today stating that she saw Dr. Marylene Land last week and there was a Rx that was supposed to be sent in to her pharmacy at no cost to her. When she went to the pharmacy ([facility]) and picked up a Rx for Tammy Obrien and it cost her $45. Patient and daughter are extremely upset that she had to pay for this medication. Tammy gave patient Tammy Obrien contact number and they will call her.

## 2015-01-07 NOTE — Telephone Encounter (Signed)
Pt's dtr, Maryruth Hancock states she wants to speak with Dr. Marylene Land about a Kerydin/Keratin medication her mtr was charged $45.00 for and was told by Dr. Marylene Land she would not have to pay.  Maryruth Hancock said if she didn't get a prompt response she would come to the [place] office.

## 2015-02-06 ENCOUNTER — Ambulatory Visit: Payer: Medicare Other | Admitting: Sports Medicine

## 2015-02-10 ENCOUNTER — Encounter: Payer: Self-pay | Admitting: Sports Medicine

## 2015-02-10 ENCOUNTER — Ambulatory Visit: Payer: Medicare Other | Admitting: Sports Medicine

## 2015-02-10 ENCOUNTER — Ambulatory Visit (INDEPENDENT_AMBULATORY_CARE_PROVIDER_SITE_OTHER): Payer: Medicare Other | Admitting: Sports Medicine

## 2015-02-10 DIAGNOSIS — M79674 Pain in right toe(s): Secondary | ICD-10-CM | POA: Diagnosis not present

## 2015-02-10 DIAGNOSIS — B351 Tinea unguium: Secondary | ICD-10-CM | POA: Diagnosis not present

## 2015-02-10 NOTE — Progress Notes (Signed)
Patient ID: Tammy Obrien, female   DOB: 1941/01/01, 74 y.o.   MRN: [id number]  Subjective: Tammy Obrien is a 74 y.o. female patient seen today in office with returns for follow up evaluation and to discuss nail culture results. Patient has been using [place] everyday and states that she feels like its helping to clear up the nails. Patient has no other pedal complaints at this time.   Patient Active Problem List   Diagnosis Date Noted  . Acute encephalopathy 12/27/2014  . Type 2 diabetes mellitus (HCC) 12/27/2014  . HTN (hypertension) 12/27/2014  . HLD (hyperlipidemia) 12/27/2014  . CAD (coronary artery disease) 12/27/2014  . RA (rheumatoid arthritis) (HCC) 12/27/2014   Current Outpatient Prescriptions on File Prior to Visit  Medication Sig Dispense Refill  . aspirin EC 81 MG tablet Take 1 tablet by mouth daily.    Marland Kitchen atorvastatin (LIPITOR) 10 MG tablet Take 1 tablet by mouth daily.    . carvedilol (COREG) 3.125 MG tablet Take 2 tablets by mouth 2 (two) times daily.     . cefUROXime (CEFTIN) 250 MG tablet Take 1 tablet (250 mg total) by mouth 2 (two) times daily with a meal. 10 tablet 0  . Cholecalciferol (VITAMIN D3) 5000 UNITS TABS Take 1 tablet by mouth daily.    . folic acid (FOLVITE) 1 MG tablet Take 1 tablet by mouth daily.    . hydrOXYzine (ATARAX/VISTARIL) 25 MG tablet Take 25 mg by mouth at bedtime.  1  . methotrexate (RHEUMATREX) 2.5 MG tablet Take 1 tablet by mouth daily.    . Multiple Vitamins-Minerals (MULTIVITAMIN ADULT PO) Take 1 tablet by mouth daily.    . potassium chloride SA (K-DUR,KLOR-CON) 20 MEQ tablet Take 2 tablets by mouth daily.    . predniSONE (DELTASONE) 1 MG tablet Take 2 tablets by mouth daily.    . traMADol (ULTRAM) 50 MG tablet Take 1 tablet by mouth 2 (two) times daily.    Marland Kitchen triamcinolone cream (KENALOG) 0.1 %     . triamterene-hydrochlorothiazide (DYAZIDE) 37.5-25 MG capsule Take 1 capsule by mouth daily.    Marland Kitchen ZETIA 10 MG tablet Take 1 tablet by  mouth daily.     No current facility-administered medications on file prior to visit.     Allergies  Allergen Reactions  . Enalaprilat   . Flexeril [Cyclobenzaprine] Itching  . Verapamil Itching  . Zocor [Simvastatin] Itching    Objective: Physical Exam  General: Well developed, nourished, no acute distress, awake, alert and oriented x 3  Vascular: Dorsalis pedis artery 2/4 bilateral, Posterior tibial artery 2/4 bilateral, skin temperature warm to warm proximal to distal bilateral lower extremities, no varicosities, pedal hair present bilateral.  Neurological: Gross sensation present via light touch bilateral.   Dermatological: Skin is warm, dry, and supple bilateral, Right 1st and 5th toenails are discolored with mild subungal debris, all other nails are within normal limits, no webspace macerations present bilateral, no open lesions present bilateral, no callus/corns/hyperkeratotic tissue present bilateral. No signs of infection bilateral.  Musculoskeletal: Mild asymptomatic hammertoes 2-5 deformities noted bilateral unchanged from prior. Muscular strength within normal limits without pain or limitation on range of motion. No pain with calf compression bilateral.  Assessment and Plan:  Problem List Items Addressed This Visit    None    Visit Diagnoses    Dermatophytosis, nail    -  Primary    Right hallux confirmed + culture of non-dermatophytic mold    Pain in toe of right foot          -  Examined patient.  -Reviewed culture results; advised patient with closely observe nail discoloration; if progress will biopsy nail matrix -Discussed continued treatment for discolored + mold culture -Patient is ok with medication co-pay of $45; will continue with Kerydin via iassist. Advised patient to file nail in between applications of Kerydin.  -Recommend good supportive shoes and monitoring of hammertoes for changes or symptoms -Patient to return in 12 weeks for follow up evaluation  or sooner if symptoms worsen.  Asencion Islam, DPM

## 2015-03-04 ENCOUNTER — Telehealth: Payer: Self-pay | Admitting: *Deleted

## 2015-03-04 NOTE — Telephone Encounter (Addendum)
Pt states she has used [place], but when refilled would cost her $150.00, pt requested a less expensive product, and pt states she would like the new prescription called to [facility].  Pt called again, and complained about the cost of the medication and I explained that Dr. Marylene Land was looking into a less expensive solution and I would call again. I explained to the pt the medication may be ordered from a compound pharmacy, but I would discuss with her later if need be.  03/05/2015  Informed pt of Dr. Wynema Birch change of orders to the [facility] - Onychomycosis Nail Lacquer, and that the pharmacy would be calling her for information and to tell her about the product.

## 2015-03-05 MED ORDER — NONFORMULARY OR COMPOUNDED ITEM: Status: AC

## 2015-03-10 DIAGNOSIS — M5412 Radiculopathy, cervical region: Secondary | ICD-10-CM | POA: Insufficient documentation

## 2015-05-05 ENCOUNTER — Ambulatory Visit: Payer: Medicare Other | Admitting: Sports Medicine

## 2015-05-12 ENCOUNTER — Ambulatory Visit: Payer: Medicare Other | Admitting: Sports Medicine

## 2015-05-19 ENCOUNTER — Ambulatory Visit: Payer: Medicare Other | Admitting: Sports Medicine

## 2015-08-04 ENCOUNTER — Encounter: Payer: Self-pay | Admitting: Sports Medicine

## 2015-08-04 ENCOUNTER — Ambulatory Visit (INDEPENDENT_AMBULATORY_CARE_PROVIDER_SITE_OTHER): Payer: Medicare Other | Admitting: Sports Medicine

## 2015-08-04 DIAGNOSIS — H01009 Unspecified blepharitis unspecified eye, unspecified eyelid: Secondary | ICD-10-CM | POA: Insufficient documentation

## 2015-08-04 DIAGNOSIS — B351 Tinea unguium: Secondary | ICD-10-CM | POA: Diagnosis not present

## 2015-08-04 DIAGNOSIS — G4733 Obstructive sleep apnea (adult) (pediatric): Secondary | ICD-10-CM | POA: Insufficient documentation

## 2015-08-04 DIAGNOSIS — M79674 Pain in right toe(s): Secondary | ICD-10-CM | POA: Diagnosis not present

## 2015-08-04 DIAGNOSIS — E7800 Pure hypercholesterolemia, unspecified: Secondary | ICD-10-CM | POA: Insufficient documentation

## 2015-08-04 DIAGNOSIS — H209 Unspecified iridocyclitis: Secondary | ICD-10-CM | POA: Insufficient documentation

## 2015-08-04 DIAGNOSIS — H04209 Unspecified epiphora, unspecified lacrimal gland: Secondary | ICD-10-CM | POA: Insufficient documentation

## 2015-08-04 DIAGNOSIS — H15109 Unspecified episcleritis, unspecified eye: Secondary | ICD-10-CM | POA: Insufficient documentation

## 2015-08-04 DIAGNOSIS — H40009 Preglaucoma, unspecified, unspecified eye: Secondary | ICD-10-CM | POA: Insufficient documentation

## 2015-08-04 DIAGNOSIS — Z961 Presence of intraocular lens: Secondary | ICD-10-CM | POA: Insufficient documentation

## 2015-08-04 DIAGNOSIS — H5052 Exophoria: Secondary | ICD-10-CM | POA: Insufficient documentation

## 2015-08-04 DIAGNOSIS — I2510 Atherosclerotic heart disease of native coronary artery without angina pectoris: Secondary | ICD-10-CM | POA: Insufficient documentation

## 2015-08-04 NOTE — Progress Notes (Signed)
Patient ID: Tammy Obrien, female   DOB: Nov 12, 1940, 75 y.o.   MRN: [id number]   Subjective: Tammy Obrien is a 75 y.o. female patient seen today in office with returns for follow up evaluation and to discuss nail fungus + mold. Patient has been using topical from [facility] and soaking and filing with a little improvement however reports that she is unsure if anything is happening; Patient has been using topical since 12/2014. Patient has no other pedal complaints at this time.   Patient Active Problem List   Diagnosis Date Noted  . Blepharitis 08/04/2015  . Arteriosclerosis of coronary artery 08/04/2015  . Epiphora 08/04/2015  . Episcleritis 08/04/2015  . 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 Glaucoma suspect 08/04/2015  . Hypercholesterolemia 08/04/2015  . Obstructive apnea 08/04/2015  . 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 Acute encephalopathy 12/27/2014  . Type 2 diabetes mellitus (HCC) 12/27/2014  . HTN (hypertension) 12/27/2014  . HLD (hyperlipidemia) 12/27/2014  . CAD (coronary artery disease) 12/27/2014  . RA (rheumatoid arthritis) (HCC) 12/27/2014  . Arthritis of knee, degenerative 03/05/2013  . Blood glucose elevated 11/26/2012  . Essential (primary) hypertension 09/05/2011  . Adiposity 09/05/2011  . Arthritis, degenerative 09/05/2011  . 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 Apnea, sleep 09/05/2011  . H/O high risk medication treatment 06/01/2011  . Degenerative arthritis of hip 12/15/2010  . Rheumatoid arthritis involving multiple joints (HCC) 10/22/2010   Current Outpatient Prescriptions on File Prior to Visit  Medication Sig Dispense Refill  . aspirin EC 81 MG tablet Take 1 tablet by mouth daily.    Marland Kitchen atorvastatin (LIPITOR) 10 MG tablet Take 1 tablet by mouth daily.    . carvedilol (COREG) 3.125 MG tablet Take 2 tablets by mouth 2 (two) times daily.     . cefUROXime (CEFTIN) 250 MG  tablet Take 1 tablet (250 mg total) by mouth 2 (two) times daily with a meal. 10 tablet 0  . Cholecalciferol (VITAMIN D3) 5000 UNITS TABS Take 1 tablet by mouth daily.    . folic acid (FOLVITE) 1 MG tablet Take 1 tablet by mouth daily.    . hydrOXYzine (ATARAX/VISTARIL) 25 MG tablet Take 25 mg by mouth at bedtime.  1  . methotrexate (RHEUMATREX) 2.5 MG tablet Take 1 tablet by mouth daily.    . Multiple Vitamins-Minerals (MULTIVITAMIN ADULT PO) Take 1 tablet by mouth daily.    . NONFORMULARY OR COMPOUNDED ITEM [facility] compound:  Onychomycosis Nail Lacquer - Fluconazole 2%, Terbinafine 1%, DMSO, apply to the affected area daily.  +11Refills. 120 each 11  . potassium chloride SA (K-DUR,KLOR-CON) 20 MEQ tablet Take 2 tablets by mouth daily.    . predniSONE (DELTASONE) 1 MG tablet Take 2 tablets by mouth daily.    . traMADol (ULTRAM) 50 MG tablet Take 1 tablet by mouth 2 (two) times daily.    Marland Kitchen triamcinolone cream (KENALOG) 0.1 %     . triamterene-hydrochlorothiazide (DYAZIDE) 37.5-25 MG capsule Take 1 capsule by mouth daily.    Marland Kitchen ZETIA 10 MG tablet Take 1 tablet by mouth daily.     No current facility-administered medications on file prior to visit.     Allergies  Allergen Reactions  . Zocor [Simvastatin] Itching and Other (See Comments)    Other Reaction: muscle aches, weakness  . Enalaprilat Other (See Comments)  . Flexeril [Cyclobenzaprine] Itching and Other (See Comments)    Other Reaction: URTICARIA  . Verapamil Itching  Objective: Physical Exam  General: Well developed, nourished, no acute distress, awake, alert and oriented x 3  Vascular: Dorsalis pedis artery 2/4 bilateral, Posterior tibial artery 2/4 bilateral, skin temperature warm to warm proximal to distal bilateral lower extremities, no varicosities, pedal hair present bilateral.  Neurological: Gross sensation present via light touch bilateral.   Dermatological: Skin is warm, dry, and supple bilateral, Right  1st and 5th toenails are discolored with mild subungal debris with melonychia, all other nails are within normal limits, no webspace macerations present bilateral, no open lesions present bilateral, no callus/corns/hyperkeratotic tissue present bilateral. No signs of infection bilateral.  Musculoskeletal: Mild asymptomatic hammertoes 2-5 deformities noted bilateral unchanged from prior. Muscular strength within normal limits without pain or limitation on range of motion. No pain with calf compression bilateral.  Assessment and Plan:  Problem List Items Addressed This Visit    None    Visit Diagnoses    Dermatophytosis, nail    -  Primary    Pain in toe of right foot          -Examined patient.  -Discussed continued treatment for discolored + mold culture -Cont with topical from [facility], advised patient that may require up to 1 year of treatment  -Advised patient in no improvement may consider punch biopsy of nail bed -Recommend good supportive shoes and monitoring of hammertoes for changes or symptoms -Patient to return in 12-16 weeks for follow up evaluation or sooner if symptoms worsen.  Asencion Islam, DPM

## 2015-12-08 ENCOUNTER — Ambulatory Visit: Payer: Medicare Other | Admitting: Podiatry

## 2016-05-19 ENCOUNTER — Ambulatory Visit
Admission: EM | Admit: 2016-05-19 | Discharge: 2016-05-19 | Disposition: A | Discharge status: Home / Self Care | Payer: Medicare Other | Attending: Internal Medicine | Admitting: Internal Medicine

## 2016-05-19 DIAGNOSIS — M069 Rheumatoid arthritis, unspecified: Secondary | ICD-10-CM | POA: Insufficient documentation

## 2016-05-19 DIAGNOSIS — I509 Heart failure, unspecified: Secondary | ICD-10-CM | POA: Insufficient documentation

## 2016-05-19 DIAGNOSIS — I110 Hypertensive heart disease with heart failure: Secondary | ICD-10-CM | POA: Insufficient documentation

## 2016-05-19 DIAGNOSIS — Z7982 Long term (current) use of aspirin: Secondary | ICD-10-CM | POA: Diagnosis not present

## 2016-05-19 DIAGNOSIS — Z888 Allergy status to other drugs, medicaments and biological substances status: Secondary | ICD-10-CM | POA: Insufficient documentation

## 2016-05-19 DIAGNOSIS — Z79899 Other long term (current) drug therapy: Secondary | ICD-10-CM | POA: Insufficient documentation

## 2016-05-19 DIAGNOSIS — E785 Hyperlipidemia, unspecified: Secondary | ICD-10-CM | POA: Insufficient documentation

## 2016-05-19 DIAGNOSIS — R350 Frequency of micturition: Secondary | ICD-10-CM | POA: Diagnosis not present

## 2016-05-19 DIAGNOSIS — I2510 Atherosclerotic heart disease of native coronary artery without angina pectoris: Secondary | ICD-10-CM | POA: Diagnosis not present

## 2016-05-19 DIAGNOSIS — Z823 Family history of stroke: Secondary | ICD-10-CM | POA: Insufficient documentation

## 2016-05-19 DIAGNOSIS — Z833 Family history of diabetes mellitus: Secondary | ICD-10-CM | POA: Insufficient documentation

## 2016-05-19 DIAGNOSIS — Z8249 Family history of ischemic heart disease and other diseases of the circulatory system: Secondary | ICD-10-CM | POA: Insufficient documentation

## 2016-05-19 DIAGNOSIS — R300 Dysuria: Secondary | ICD-10-CM | POA: Diagnosis not present

## 2016-05-19 DIAGNOSIS — E079 Disorder of thyroid, unspecified: Secondary | ICD-10-CM | POA: Diagnosis not present

## 2016-05-19 DIAGNOSIS — Z809 Family history of malignant neoplasm, unspecified: Secondary | ICD-10-CM | POA: Diagnosis not present

## 2016-05-19 LAB — URINALYSIS, COMPLETE (UACMP) WITH MICROSCOPIC
Bilirubin Urine: NEGATIVE
Glucose, UA: NEGATIVE mg/dL
Ketones, ur: NEGATIVE mg/dL
Leukocytes, UA: NEGATIVE
Nitrite: NEGATIVE
Protein, ur: NEGATIVE mg/dL
Specific Gravity, Urine: 1.02 (ref 1.005–1.030)
Squamous Epithelial / LPF: NONE SEEN
pH: 6.5 (ref 5.0–8.0)

## 2016-05-19 MED ORDER — PHENAZOPYRIDINE HCL 200 MG PO TABS: 200.0000 mg | ORAL_TABLET | Freq: Three times a day (TID) | ORAL | 0 refills | Status: DC | PRN

## 2016-05-19 MED ORDER — CEPHALEXIN 500 MG PO CAPS: 500.0000 mg | ORAL_CAPSULE | Freq: Two times a day (BID) | ORAL | 0 refills | Status: AC

## 2016-05-19 NOTE — ED Triage Notes (Signed)
Pt reports frequency and dysuria starting last p.m. Denies blood in urine or flank pain.

## 2016-05-19 NOTE — ED Provider Notes (Signed)
CSN: [id number]     Arrival date & time 05/19/16  0813 History   First MD Initiated Contact with Patient 05/19/16 [phone]     Chief Complaint  Patient presents with  . Urinary Frequency   (Consider location/radiation/quality/duration/timing/severity/associated sxs/prior Treatment) 76 year old female presents with dysuria, frequency and urgency that started last evening. Denies any fever, blood in her urine, unusual vaginal discharge or other GI symptoms. Has tried drinking cranberry juice with minimal relief. Has history of frequent UTI's- last one in 2016. Also has history of HTN, CAD, hyperlipidemia, and RA and on multiple medications.    The history is provided by the patient.    Past Medical History:  Diagnosis Date  . Arterial stent thrombosis (HCC)   . CAD (coronary artery disease)   . CHF (congestive heart failure) (HCC)   . HLD (hyperlipidemia)   . Hypertension   . RA (rheumatoid arthritis) (HCC)   . Thyroid disease   . Type 2 diabetes mellitus (HCC)    Past Surgical History:  Procedure Laterality Date  . ABDOMINAL HYSTERECTOMY    . BLEPHAROPLASTY    . CARDIAC SURGERY    . CATARACT EXTRACTION     Family History  Problem Relation Age of Onset  . Hypertension Mother   . Cancer Mother   . Glaucoma Mother   . Cancer Father   . Heart attack Sister   . Diabetes Brother   . Stroke Brother   . Glaucoma Brother    Social History  Substance Use Topics  . Smoking status: Never Smoker  . Smokeless tobacco: Never Used  . Alcohol use No   OB History    Gravida Para Term Preterm AB Living   2 2           SAB TAB Ectopic Multiple Live Births                 Review of Systems  Constitutional: Negative for appetite change, chills, fatigue and fever.  Respiratory: Negative for cough, chest tightness and shortness of breath.   Cardiovascular: Negative for chest pain.  Gastrointestinal: Negative for abdominal pain, constipation, diarrhea, nausea and vomiting.   Genitourinary: Positive for decreased urine volume, dysuria, frequency and urgency. Negative for flank pain, hematuria, pelvic pain, vaginal discharge and vaginal pain.  Musculoskeletal: Negative for back pain and neck pain.  Skin: Negative for rash and wound.  Allergic/Immunologic: Positive for immunocompromised state.  Neurological: Negative for dizziness, weakness, numbness and headaches.  Hematological: Negative for adenopathy.    Allergies  Zocor [simvastatin]; Enalaprilat; Flexeril [cyclobenzaprine]; and Verapamil  Home Medications   Prior to Admission medications   Medication Sig Start Date End Date Taking? Authorizing Provider  aspirin EC 81 MG tablet Take 1 tablet by mouth daily. 10/09/13   Historical Provider, MD  atorvastatin (LIPITOR) 10 MG tablet Take 1 tablet by mouth daily. 10/06/14   Historical Provider, MD  carvedilol (COREG) 3.125 MG tablet Take 2 tablets by mouth 2 (two) times daily.  11/05/14   Historical Provider, MD  cephALEXin (KEFLEX) 500 MG capsule Take 1 capsule (500 mg total) by mouth 2 (two) times daily. 05/19/16 05/26/16  Sudie Grumbling, NP  Cholecalciferol (VITAMIN D3) 5000 UNITS TABS Take 1 tablet by mouth daily.    Historical Provider, MD  folic acid (FOLVITE) 1 MG tablet Take 1 tablet by mouth daily. 10/06/14   Historical Provider, MD  methotrexate (RHEUMATREX) 2.5 MG tablet Take 1 tablet by mouth daily. 12/05/14   Historical  Provider, MD  Multiple Vitamins-Minerals (MULTIVITAMIN ADULT PO) Take 1 tablet by mouth daily. 04/02/07   Historical Provider, MD  NONFORMULARY OR COMPOUNDED ITEM [facility] compound:  Onychomycosis Nail Lacquer - Fluconazole 2%, Terbinafine 1%, DMSO, apply to the affected area daily.  +11Refills. 03/05/15   Asencion Islam, DPM  phenazopyridine (PYRIDIUM) 200 MG tablet Take 1 tablet (200 mg total) by mouth 3 (three) times daily as needed for pain. 05/19/16   Sudie Grumbling, NP  potassium chloride SA (K-DUR,KLOR-CON) 20 MEQ tablet Take 2  tablets by mouth daily. 12/22/14   Historical Provider, MD  predniSONE (DELTASONE) 1 MG tablet Take 2 tablets by mouth daily. 11/21/14   Historical Provider, MD  traMADol (ULTRAM) 50 MG tablet Take 1 tablet by mouth 2 (two) times daily. 10/06/14   Historical Provider, MD  triamcinolone cream (KENALOG) 0.1 %  11/18/14   Historical Provider, MD  triamterene-hydrochlorothiazide (DYAZIDE) 37.5-25 MG capsule Take 1 capsule by mouth daily. 11/05/14   Historical Provider, MD  ZETIA 10 MG tablet Take 1 tablet by mouth daily. 09/18/14   Historical Provider, MD   Meds Ordered and Administered this Visit  Medications - No data to display  BP (!) 145/66 (BP Location: Left Arm)   Pulse 67   Temp 97.7 F (36.5 C) (Oral)   Resp 18   Ht 5' 6.5" (1.689 m)   Wt 182 lb (82.6 kg)   SpO2 100%   BMI 28.94 kg/m  No data found.   Physical Exam  Constitutional: She is oriented to person, place, and time. She appears well-developed and well-nourished. No distress.  HENT:  Head: Normocephalic and atraumatic.  Neck: Normal range of motion. Neck supple.  Cardiovascular: Normal rate, regular rhythm and normal heart sounds.   Pulmonary/Chest: Effort normal and breath sounds normal. No respiratory distress.  Abdominal: Soft. Normal appearance and bowel sounds are normal. She exhibits no distension and no mass. There is no tenderness. There is no rigidity, no rebound, no guarding and no CVA tenderness.  Neurological: She is alert and oriented to person, place, and time.  Skin: Skin is warm and dry.  Psychiatric: She has a normal mood and affect. Her behavior is normal. Judgment and thought content normal.    Urgent Care Course     Procedures (including critical care time)  Labs Review Labs Reviewed  URINALYSIS, COMPLETE (UACMP) WITH MICROSCOPIC - Abnormal; Notable for the following:       Result Value   Hgb urine dipstick TRACE (*)    Bacteria, UA FEW (*)    All other components within normal limits     Imaging Review No results found.   Visual Acuity Review  Right Eye Distance:   Left Eye Distance:   Bilateral Distance:    Right Eye Near:   Left Eye Near:    Bilateral Near:         MDM   1. Dysuria   2. Urinary frequency    Reviewed urinalysis results with patient- minimal volume so unable to determine all components. Lab personal had written down that WBC count was increased even though it is not documented as abnormal. Discussed that we will treat her for a UTI and will send her urine for culture. Recommend start Keflex 500mg  twice a day as directed. May use Pyridium every 8 hours as needed for urinary pain. Continue to increase fluids. Follow-up pending urine culture results or go to ER if pain worsens.     ,  NP 05/19/16 1359

## 2016-05-19 NOTE — Discharge Instructions (Signed)
Start Keflex twice a day as directed. May take Pyridium every 8 hours as needed for urinary pain. Continue to increase fluid intake. Follow-up pending urine culture results.

## 2016-05-31 ENCOUNTER — Encounter: Payer: Self-pay | Admitting: Podiatry

## 2016-05-31 ENCOUNTER — Ambulatory Visit (INDEPENDENT_AMBULATORY_CARE_PROVIDER_SITE_OTHER): Payer: Medicare Other | Admitting: Podiatry

## 2016-05-31 ENCOUNTER — Ambulatory Visit (INDEPENDENT_AMBULATORY_CARE_PROVIDER_SITE_OTHER): Payer: Medicare Other

## 2016-05-31 DIAGNOSIS — Z79899 Other long term (current) drug therapy: Secondary | ICD-10-CM | POA: Diagnosis not present

## 2016-05-31 DIAGNOSIS — M79674 Pain in right toe(s): Secondary | ICD-10-CM

## 2016-05-31 DIAGNOSIS — B351 Tinea unguium: Secondary | ICD-10-CM

## 2016-05-31 DIAGNOSIS — M898X9 Other specified disorders of bone, unspecified site: Secondary | ICD-10-CM | POA: Diagnosis not present

## 2016-05-31 DIAGNOSIS — M7989 Other specified soft tissue disorders: Secondary | ICD-10-CM

## 2016-05-31 MED ORDER — TERBINAFINE HCL 250 MG PO TABS: 250.0000 mg | ORAL_TABLET | Freq: Every day | ORAL | 0 refills | Status: DC

## 2016-06-01 ENCOUNTER — Ambulatory Visit
Admission: EM | Admit: 2016-06-01 | Discharge: 2016-06-01 | Disposition: A | Discharge status: Home / Self Care | Payer: Medicare Other | Attending: Family Medicine | Admitting: Family Medicine

## 2016-06-01 ENCOUNTER — Encounter: Payer: Self-pay | Admitting: *Deleted

## 2016-06-01 ENCOUNTER — Telehealth: Payer: Self-pay | Admitting: *Deleted

## 2016-06-01 DIAGNOSIS — I2510 Atherosclerotic heart disease of native coronary artery without angina pectoris: Secondary | ICD-10-CM | POA: Diagnosis not present

## 2016-06-01 DIAGNOSIS — E119 Type 2 diabetes mellitus without complications: Secondary | ICD-10-CM | POA: Insufficient documentation

## 2016-06-01 DIAGNOSIS — I509 Heart failure, unspecified: Secondary | ICD-10-CM | POA: Diagnosis not present

## 2016-06-01 DIAGNOSIS — I110 Hypertensive heart disease with heart failure: Secondary | ICD-10-CM | POA: Diagnosis not present

## 2016-06-01 DIAGNOSIS — R1030 Lower abdominal pain, unspecified: Secondary | ICD-10-CM

## 2016-06-01 DIAGNOSIS — N390 Urinary tract infection, site not specified: Secondary | ICD-10-CM | POA: Insufficient documentation

## 2016-06-01 DIAGNOSIS — R350 Frequency of micturition: Secondary | ICD-10-CM | POA: Diagnosis present

## 2016-06-01 DIAGNOSIS — E079 Disorder of thyroid, unspecified: Secondary | ICD-10-CM | POA: Diagnosis not present

## 2016-06-01 DIAGNOSIS — Z7982 Long term (current) use of aspirin: Secondary | ICD-10-CM | POA: Diagnosis not present

## 2016-06-01 DIAGNOSIS — R300 Dysuria: Secondary | ICD-10-CM | POA: Diagnosis present

## 2016-06-01 DIAGNOSIS — Z79899 Other long term (current) drug therapy: Secondary | ICD-10-CM | POA: Diagnosis not present

## 2016-06-01 LAB — HEPATIC FUNCTION PANEL
ALK PHOS: 130 IU/L — AB (ref 39–117)
ALT: 16 IU/L (ref 0–32)
AST: 17 IU/L (ref 0–40)
Albumin: 4.1 g/dL (ref 3.5–4.8)
BILIRUBIN, DIRECT: 0.13 mg/dL (ref 0.00–0.40)
Bilirubin Total: 0.2 mg/dL (ref 0.0–1.2)
TOTAL PROTEIN: 7 g/dL (ref 6.0–8.5)

## 2016-06-01 LAB — URINALYSIS, COMPLETE (UACMP) WITH MICROSCOPIC
Glucose, UA: 100 mg/dL — AB
Ketones, ur: NEGATIVE mg/dL
Leukocytes, UA: NEGATIVE
Nitrite: POSITIVE — AB
SPECIFIC GRAVITY, URINE: 1.015 (ref 1.005–1.030)
pH: 6 (ref 5.0–8.0)

## 2016-06-01 MED ORDER — NITROFURANTOIN MONOHYD MACRO 100 MG PO CAPS: 100.0000 mg | ORAL_CAPSULE | Freq: Two times a day (BID) | ORAL | 0 refills | Status: DC

## 2016-06-01 MED ORDER — FLUCONAZOLE 150 MG PO TABS: 150.0000 mg | ORAL_TABLET | Freq: Once | ORAL | 0 refills | Status: AC

## 2016-06-01 MED ORDER — PHENAZOPYRIDINE HCL 200 MG PO TABS: 200.0000 mg | ORAL_TABLET | Freq: Three times a day (TID) | ORAL | 0 refills | Status: DC | PRN

## 2016-06-01 NOTE — Progress Notes (Signed)
Please contact patient and let her know that one of her liver enzymes is elevated and to follow-up with her PCP. Discontinue Lamisil at the moment.  Patient is also scheduled for an unrelated surgery. Follow-up 1 week postop.  Note dictated. That's, Dr. Logan Bores

## 2016-06-01 NOTE — ED Triage Notes (Signed)
Dysuria, urinary freq/urg, suprapubic pain, for more than a week. Pt seen here 1-2 weeks ago for same complaint, states not getting any better.

## 2016-06-01 NOTE — Progress Notes (Signed)
Patient ID: Polly Barner, female   DOB: 07-12-1940, 76 y.o.   MRN: [id number]   Subjective:  Patient presents today for follow-up evaluation and treatment of fungal nail to the right great toe this been present for several years now. Patient was last seen 08/04/2015 under the care of Dr. Marylene Land. At the time she was prescribed antifungal nail lacquer through [facility]. Patient states that that topical nail lacquer does not help. Patient also presents for a new complaint of a lump to the dorsal aspect of her right great toe that is been present for approximately 1 month now. Patient states that is painful in shoe gear. Patient denies any trauma.    Objective/Physical Exam General: The patient is alert and oriented x3 in no acute distress.  Dermatology: Hyperkeratotic, discolored, onychodystrophy nail noted to the right great toe. Skin is warm, dry and supple bilateral lower extremities. Negative for open lesions or macerations.  Vascular: Palpable pedal pulses bilaterally. No edema or erythema noted. Capillary refill within normal limits.  Neurological: Epicritic and protective threshold grossly intact bilaterally.   Musculoskeletal Exam: There is a palpable mass noted to the dorsal aspect of the proximal base of the phalanx right great toe. The mass is mobile however feels very hard on palpation. Range of motion within normal limits to all pedal and ankle joints bilateral. Muscle strength 5/5 in all groups bilateral.   Radiographic Exam:  Normal osseous mineralization. Joint spaces preserved. No fracture/dislocation/boney destruction.   Radiographic exam does not show that the palpable mass to the right great toe is osseous in nature.  Assessment: #1 symptomatic soft tissue mass right great toe #2 onychomycosis right great toe   Plan of Care:  #1 Patient was evaluated. #2 continue antifungal nail lacquer. #3 today were provided new prescription for antifungal nail lacquer  through [facility] #4 hepatic function panel ordered today #5 prescription for terbinafine 250 mg has take 90 #6 schedule an appt with Shanda Bumps for Laser Nail  #7 today we discussed conservative versus surgical management of the small soft tissue mass to the dorsal aspect of the right great toe. Patient opts for surgical excision. Authorization for surgery initiated today. All possible complications and details of the surgery were explained. All patient questions were answered. #8 postoperative shoe dispensed #9 return to clinic 1 week postop   Felecia Shelling, DPM [facility]  Dr. Felecia Shelling, DPM    [address]                                        [place], [place] [address]                Office [phone]  Fax [phone]

## 2016-06-01 NOTE — ED Provider Notes (Signed)
[facility]    CSN: [id number] Arrival date & time: 06/01/16  0818     History   Chief Complaint Chief Complaint  Patient presents with  . Dysuria  . Urinary Frequency  . Abdominal Pain    HPI Tammy Obrien is a 76 y.o. female.   Patient's here because of recurrent UTI. She was seen here about 2 weeks ago diagnosed UTI placed on Keflex 500 mg for a week states she seems she was getting better but he didn't have enough antibiotics. She's also expressed concern because she doesn't usually get UTIs. She's had abdominal hysterectomy she's not sexually active she is currently divorced. She has been using Pyridium which helped she's had burning with urination and frequency and lower abdominal pelvic pain but no discharge. His history of atrial stent thrombosis CAD CHF hypertension thyroid disease type 2 diabetes. She had encephalopathy about 2 years ago requiring hospitalization. She has had a total abdominal hysterectomy via surgery cardiac surgery and cataract removal.   The history is provided by the patient. No language interpreter was used.  Dysuria  Pain quality:  Shooting Pain severity:  Moderate Onset quality:  Sudden Timing:  Constant Progression:  Worsening Chronicity:  New Recent urinary tract infections: yes   Relieved by:  Nothing Ineffective treatments:  None tried Associated symptoms: abdominal pain   Associated symptoms: no fever   Risk factors: no hx of pyelonephritis, no recurrent urinary tract infections, not sexually active, no sexually transmitted infections and no single kidney   Urinary Frequency  Associated symptoms include abdominal pain.  Abdominal Pain  Associated symptoms: dysuria   Associated symptoms: no fever     Past Medical History:  Diagnosis Date  . Arterial stent thrombosis (HCC)   . CAD (coronary artery disease)   . CHF (congestive heart failure) (HCC)   . HLD (hyperlipidemia)   . Hypertension   . RA (rheumatoid  arthritis) (HCC)   . Thyroid disease   . Type 2 diabetes mellitus [facility])     Patient Active Problem List   Diagnosis Date Noted  . Blepharitis 08/04/2015  . Arteriosclerosis of coronary artery 08/04/2015  . Epiphora 08/04/2015  . Episcleritis 08/04/2015  . 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 Glaucoma suspect 08/04/2015  . Hypercholesterolemia 08/04/2015  . Obstructive apnea 08/04/2015  . 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 Acute encephalopathy 12/27/2014  . Type 2 diabetes mellitus (HCC) 12/27/2014  . HTN (hypertension) 12/27/2014  . HLD (hyperlipidemia) 12/27/2014  . CAD (coronary artery disease) 12/27/2014  . RA (rheumatoid arthritis) (HCC) 12/27/2014  . Arthritis of knee, degenerative 03/05/2013  . Blood glucose elevated 11/26/2012  . Essential (primary) hypertension 09/05/2011  . Adiposity 09/05/2011  . Arthritis, degenerative 09/05/2011  . 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 Apnea, sleep 09/05/2011  . H/O high risk medication treatment 06/01/2011  . Degenerative arthritis of hip 12/15/2010  . Rheumatoid arthritis involving multiple joints (HCC) 10/22/2010    Past Surgical History:  Procedure Laterality Date  . ABDOMINAL HYSTERECTOMY    . BLEPHAROPLASTY    . CARDIAC SURGERY    . CATARACT EXTRACTION      OB History    Gravida Para Term Preterm AB Living   2 2           SAB TAB Ectopic Multiple Live Births                   Home  Medications    Prior to Admission medications   Medication Sig Start Date End Date Taking? Authorizing Provider  aspirin EC 81 MG tablet Take 1 tablet by mouth daily. 10/09/13  Yes Historical Provider, MD  atorvastatin (LIPITOR) 10 MG tablet Take 1 tablet by mouth daily. 10/06/14  Yes Historical Provider, MD  carvedilol (COREG) 3.125 MG tablet Take 2 tablets by mouth 2 (two) times daily.  11/05/14  Yes Historical Provider, MD  Cholecalciferol (VITAMIN D3)  5000 UNITS TABS Take 1 tablet by mouth daily.   Yes Historical Provider, MD  folic acid (FOLVITE) 1 MG tablet Take 1 tablet by mouth daily. 10/06/14  Yes Historical Provider, MD  methotrexate (RHEUMATREX) 2.5 MG tablet Take 1 tablet by mouth daily. 12/05/14  Yes Historical Provider, MD  Multiple Vitamins-Minerals (MULTIVITAMIN ADULT PO) Take 1 tablet by mouth daily. 04/02/07  Yes Historical Provider, MD  phenazopyridine (PYRIDIUM) 200 MG tablet Take 1 tablet (200 mg total) by mouth 3 (three) times daily as needed for pain. 05/19/16  Yes Sudie Grumbling, NP  potassium chloride SA (K-DUR,KLOR-CON) 20 MEQ tablet Take 2 tablets by mouth daily. 12/22/14  Yes Historical Provider, MD  terbinafine (LAMISIL) 250 MG tablet Take 1 tablet (250 mg total) by mouth daily. 05/31/16  Yes Felecia Shelling, DPM  triamterene-hydrochlorothiazide (DYAZIDE) 37.5-25 MG capsule Take 1 capsule by mouth daily. 11/05/14  Yes Historical Provider, MD  ZETIA 10 MG tablet Take 1 tablet by mouth daily. 09/18/14  Yes Historical Provider, MD  fluconazole (DIFLUCAN) 150 MG tablet Take 1 tablet (150 mg total) by mouth once. Repeat if needed in one week 06/01/16 06/01/16  Hassan Rowan, MD  nitrofurantoin, macrocrystal-monohydrate, (MACROBID) 100 MG capsule Take 1 capsule (100 mg total) by mouth 2 (two) times daily. 06/01/16   Hassan Rowan, MD  NONFORMULARY OR COMPOUNDED ITEM [facility] compound:  Onychomycosis Nail Lacquer - Fluconazole 2%, Terbinafine 1%, DMSO, apply to the affected area daily.  +11Refills. 03/05/15   Asencion Islam, DPM  phenazopyridine (PYRIDIUM) 200 MG tablet Take 1 tablet (200 mg total) by mouth 3 (three) times daily as needed for pain. 06/01/16   Hassan Rowan, MD  predniSONE (DELTASONE) 1 MG tablet Take 2 tablets by mouth daily. 11/21/14   Historical Provider, MD  traMADol (ULTRAM) 50 MG tablet Take 1 tablet by mouth 2 (two) times daily. 10/06/14   Historical Provider, MD  triamcinolone cream (KENALOG) 0.1 %  11/18/14   Historical  Provider, MD    Family History Family History  Problem Relation Age of Onset  . Hypertension Mother   . Cancer Mother   . Glaucoma Mother   . Cancer Father   . Heart attack Sister   . Diabetes Brother   . Stroke Brother   . Glaucoma Brother     Social History Social History  Substance Use Topics  . Smoking status: Never Smoker  . Smokeless tobacco: Never Used  . Alcohol use No     Allergies   Zocor [simvastatin]; Enalaprilat; Flexeril [cyclobenzaprine]; and Verapamil   Review of Systems Review of Systems  Constitutional: Negative for fever.  Gastrointestinal: Positive for abdominal pain.  Genitourinary: Positive for dysuria and frequency.  All other systems reviewed and are negative.    Physical Exam Triage Vital Signs ED Triage Vitals  Enc Vitals Group     BP 06/01/16 0828 (!) 168/77     Pulse Rate 06/01/16 0828 68     Resp 06/01/16 0828 16     Temp 06/01/16  0828 98 F (36.7 C)     Temp Source 06/01/16 0828 Oral     SpO2 06/01/16 0828 98 %     Weight 06/01/16 0832 180 lb (81.6 kg)     Height 06/01/16 0832 5\' 7"  (1.702 m)     Head Circumference --      Peak Flow --      Pain Score --      Pain Loc --      Pain Edu? --      Excl. in GC? --    No data found.   Updated Vital Signs BP (!) 168/77 (BP Location: Left Arm)   Pulse 68   Temp 98 F (36.7 C) (Oral)   Resp 16   Ht 5\' 7"  (1.702 m)   Wt 180 lb (81.6 kg)   SpO2 98%   BMI 28.19 kg/m   Visual Acuity Right Eye Distance:   Left Eye Distance:   Bilateral Distance:    Right Eye Near:   Left Eye Near:    Bilateral Near:     Physical Exam  Constitutional: She is oriented to person, place, and time. She appears well-developed and well-nourished.  HENT:  Head: Normocephalic and atraumatic.  Right Ear: External ear normal.  Left Ear: External ear normal.  Eyes: Pupils are equal, round, and reactive to light.  Neck: Normal range of motion.  Pulmonary/Chest: Breath sounds normal.    Abdominal: Soft. Bowel sounds are normal. There is tenderness.    Musculoskeletal: Normal range of motion.  Neurological: She is alert and oriented to person, place, and time.  Skin: Skin is warm.  Psychiatric: She has a normal mood and affect.  Vitals reviewed.    UC Treatments / Results  Labs (all labs ordered are listed, but only abnormal results are displayed) Labs Reviewed  URINALYSIS, COMPLETE (UACMP) WITH MICROSCOPIC - Abnormal; Notable for the following:       Result Value   Color, Urine ORANGE (*)    Glucose, UA 100 (*)    Hgb urine dipstick TRACE (*)    Bilirubin Urine SMALL (*)    Protein, ur TRACE (*)    Nitrite POSITIVE (*)    Squamous Epithelial / LPF 0-5 (*)    Bacteria, UA RARE (*)    All other components within normal limits  URINE CULTURE    EKG  EKG Interpretation None       Radiology Dg Foot Complete Right  Result Date: 05/31/2016 Please see detailed radiograph report in office note.   Procedures Procedures (including critical care time)  Medications Ordered in UC Medications - No data to display   Initial Impression / Assessment and Plan / UC Course  I have reviewed the triage vital signs and the nursing notes.  Pertinent labs & imaging results that were available during my care of the patient were reviewed by me and considered in my medical decision making (see chart for details).     Results for orders placed or performed during the hospital encounter of 06/01/16  Urinalysis, Complete w Microscopic  Result Value Ref Range   Color, Urine ORANGE (A) YELLOW   APPearance CLEAR CLEAR   Specific Gravity, Urine 1.015 1.005 - 1.030   pH 6.0 5.0 - 8.0   Glucose, UA 100 (A) NEGATIVE mg/dL   Hgb urine dipstick TRACE (A) NEGATIVE   Bilirubin Urine SMALL (A) NEGATIVE   Ketones, ur NEGATIVE NEGATIVE mg/dL   Protein, ur TRACE (A) NEGATIVE mg/dL  Nitrite POSITIVE (A) NEGATIVE   Leukocytes, UA NEGATIVE NEGATIVE   Squamous Epithelial / LPF  0-5 (A) NONE SEEN   WBC, UA 6-30 0 - 5 WBC/hpf   RBC / HPF 6-30 0 - 5 RBC/hpf   Bacteria, UA RARE (A) NONE SEEN   Apparently urine culture was not done last visit we'll obtain a urine culture today. Place on Macrobid 100 mg 1 capsule twice a day Diflucan if needed since this will be the second round of antibiotics and will place her on Macrobid since pH is 6 twice a day for 10 days. PCP as needed Final Clinical Impressions(s) / UC Diagnoses   Final diagnoses:  Lower urinary tract infectious disease    New Prescriptions New Prescriptions   FLUCONAZOLE (DIFLUCAN) 150 MG TABLET    Take 1 tablet (150 mg total) by mouth once. Repeat if needed in one week   NITROFURANTOIN, MACROCRYSTAL-MONOHYDRATE, (MACROBID) 100 MG CAPSULE    Take 1 capsule (100 mg total) by mouth 2 (two) times daily.   PHENAZOPYRIDINE (PYRIDIUM) 200 MG TABLET    Take 1 tablet (200 mg total) by mouth 3 (three) times daily as needed for pain.        Hassan Rowan, MD 06/01/16 [phone]

## 2016-06-01 NOTE — Telephone Encounter (Addendum)
-----   Message from Felecia Shelling, DPM sent at 06/01/2016  8:11 AM EDT ----- Please contact patient and let her know that one of her liver enzymes is elevated and to follow-up with her PCP. Discontinue Lamisil at the moment.  Patient is also scheduled for an unrelated surgery. Follow-up 1 week postop.  Note dictated. That's, Dr. Logan Bores. I informed pt of Dr. Logan Bores orders, pt states she has an appt with Dr. Greggory Stallion at [facility] in [place] [phone] next Tuesday. I told her I woul fax copy of her labs to Dr. Greggory Stallion for her appt time. Faxed las of 05/31/2016 to Dr. Greggory Stallion.

## 2016-06-03 LAB — URINE CULTURE
Culture: >=100000 — AB
Special Requests: NORMAL

## 2016-06-30 ENCOUNTER — Ambulatory Visit: Payer: Medicare Other

## 2016-08-19 ENCOUNTER — Encounter: Payer: Self-pay | Admitting: *Deleted

## 2016-08-19 ENCOUNTER — Ambulatory Visit
Admission: EM | Admit: 2016-08-19 | Discharge: 2016-08-19 | Disposition: A | Discharge status: Home / Self Care | Payer: Medicare Other | Attending: Family Medicine | Admitting: Family Medicine

## 2016-08-19 DIAGNOSIS — I110 Hypertensive heart disease with heart failure: Secondary | ICD-10-CM | POA: Diagnosis not present

## 2016-08-19 DIAGNOSIS — E669 Obesity, unspecified: Secondary | ICD-10-CM | POA: Diagnosis not present

## 2016-08-19 DIAGNOSIS — Z888 Allergy status to other drugs, medicaments and biological substances status: Secondary | ICD-10-CM | POA: Insufficient documentation

## 2016-08-19 DIAGNOSIS — I2510 Atherosclerotic heart disease of native coronary artery without angina pectoris: Secondary | ICD-10-CM | POA: Insufficient documentation

## 2016-08-19 DIAGNOSIS — E079 Disorder of thyroid, unspecified: Secondary | ICD-10-CM | POA: Insufficient documentation

## 2016-08-19 DIAGNOSIS — I509 Heart failure, unspecified: Secondary | ICD-10-CM | POA: Diagnosis not present

## 2016-08-19 DIAGNOSIS — Z7982 Long term (current) use of aspirin: Secondary | ICD-10-CM | POA: Insufficient documentation

## 2016-08-19 DIAGNOSIS — E119 Type 2 diabetes mellitus without complications: Secondary | ICD-10-CM | POA: Insufficient documentation

## 2016-08-19 DIAGNOSIS — Z90710 Acquired absence of both cervix and uterus: Secondary | ICD-10-CM | POA: Insufficient documentation

## 2016-08-19 DIAGNOSIS — R3915 Urgency of urination: Secondary | ICD-10-CM | POA: Insufficient documentation

## 2016-08-19 DIAGNOSIS — H15109 Unspecified episcleritis, unspecified eye: Secondary | ICD-10-CM | POA: Diagnosis not present

## 2016-08-19 DIAGNOSIS — E7800 Pure hypercholesterolemia, unspecified: Secondary | ICD-10-CM | POA: Diagnosis not present

## 2016-08-19 DIAGNOSIS — G9340 Encephalopathy, unspecified: Secondary | ICD-10-CM | POA: Diagnosis not present

## 2016-08-19 DIAGNOSIS — R300 Dysuria: Secondary | ICD-10-CM | POA: Insufficient documentation

## 2016-08-19 DIAGNOSIS — N390 Urinary tract infection, site not specified: Secondary | ICD-10-CM | POA: Diagnosis present

## 2016-08-19 DIAGNOSIS — R319 Hematuria, unspecified: Secondary | ICD-10-CM | POA: Insufficient documentation

## 2016-08-19 DIAGNOSIS — R0681 Apnea, not elsewhere classified: Secondary | ICD-10-CM | POA: Diagnosis not present

## 2016-08-19 DIAGNOSIS — Z79899 Other long term (current) drug therapy: Secondary | ICD-10-CM | POA: Insufficient documentation

## 2016-08-19 DIAGNOSIS — H5052 Exophoria: Secondary | ICD-10-CM | POA: Diagnosis not present

## 2016-08-19 DIAGNOSIS — H209 Unspecified iridocyclitis: Secondary | ICD-10-CM | POA: Insufficient documentation

## 2016-08-19 DIAGNOSIS — Z8249 Family history of ischemic heart disease and other diseases of the circulatory system: Secondary | ICD-10-CM | POA: Insufficient documentation

## 2016-08-19 DIAGNOSIS — M069 Rheumatoid arthritis, unspecified: Secondary | ICD-10-CM | POA: Diagnosis not present

## 2016-08-19 DIAGNOSIS — Z823 Family history of stroke: Secondary | ICD-10-CM | POA: Insufficient documentation

## 2016-08-19 DIAGNOSIS — Z809 Family history of malignant neoplasm, unspecified: Secondary | ICD-10-CM | POA: Insufficient documentation

## 2016-08-19 DIAGNOSIS — Z955 Presence of coronary angioplasty implant and graft: Secondary | ICD-10-CM | POA: Diagnosis not present

## 2016-08-19 DIAGNOSIS — Z9889 Other specified postprocedural states: Secondary | ICD-10-CM | POA: Insufficient documentation

## 2016-08-19 DIAGNOSIS — Z833 Family history of diabetes mellitus: Secondary | ICD-10-CM | POA: Insufficient documentation

## 2016-08-19 DIAGNOSIS — H01009 Unspecified blepharitis unspecified eye, unspecified eyelid: Secondary | ICD-10-CM | POA: Insufficient documentation

## 2016-08-19 LAB — URINALYSIS, COMPLETE (UACMP) WITH MICROSCOPIC

## 2016-08-19 MED ORDER — FLUCONAZOLE 150 MG PO TABS: 150.0000 mg | ORAL_TABLET | Freq: Every day | ORAL | 0 refills | Status: DC

## 2016-08-19 MED ORDER — NITROFURANTOIN MONOHYD MACRO 100 MG PO CAPS: 100.0000 mg | ORAL_CAPSULE | Freq: Two times a day (BID) | ORAL | 0 refills | Status: DC

## 2016-08-19 NOTE — Discharge Instructions (Signed)
Take medication as prescribed. Rest. Drink plenty of fluids.   Follow up with your primary care physician next week. Return to Urgent care for new or worsening concerns.

## 2016-08-19 NOTE — ED Provider Notes (Signed)
[facility] ____________________________________________  Time seen: Approximately 8:55 AM  I have reviewed the triage vital signs and the nursing notes.   HISTORY  Chief Complaint Dysuria   HPI Tammy Obrien is a 76 y.o. female presenting for complaints of urinary frequency, urinary urgency, burning with urination and super pubic pressure medicine present since yesterday. Patient reports current complaints and feels similar to previous UTIs. Reports last UTI was early April of this year. Reports otherwise feels well. Denies vaginal complaints. Denies fevers, nausea, vomiting, back pain. Denies any other abdominal discomfort. Denies other recent antibiotic use. Reports continues to eat and drink well. Denies any trigger. States to take 1 over-the-counter Azo tablet to help with burning which helps some. Denies any other alleviating measures taken. Denies aggravating factors.  Denies chest pain, shortness of breath, abdominal pain, or rash. Denies recent sickness. Denies recent antibiotic use.   Leim Fabry, MD: PCP  Past Medical History:  Diagnosis Date  . Arterial stent thrombosis (HCC)   . CAD (coronary artery disease)   . CHF (congestive heart failure) (HCC)   . HLD (hyperlipidemia)   . Hypertension   . RA (rheumatoid arthritis) (HCC)   . Thyroid disease   . Type 2 diabetes mellitus [facility])     Patient Active Problem List   Diagnosis Date Noted  . Blepharitis 08/04/2015  . Arteriosclerosis of coronary artery 08/04/2015  . Epiphora 08/04/2015  . Episcleritis 08/04/2015  . 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 Glaucoma suspect 08/04/2015  . Hypercholesterolemia 08/04/2015  . Obstructive apnea 08/04/2015  . 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 Acute encephalopathy 12/27/2014  . Type 2 diabetes mellitus (HCC) 12/27/2014  . HTN (hypertension) 12/27/2014  . HLD (hyperlipidemia) 12/27/2014  . CAD  (coronary artery disease) 12/27/2014  . RA (rheumatoid arthritis) (HCC) 12/27/2014  . Arthritis of knee, degenerative 03/05/2013  . Blood glucose elevated 11/26/2012  . Essential (primary) hypertension 09/05/2011  . Adiposity 09/05/2011  . Arthritis, degenerative 09/05/2011  . 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 Apnea, sleep 09/05/2011  . H/O high risk medication treatment 06/01/2011  . Degenerative arthritis of hip 12/15/2010  . Rheumatoid arthritis involving multiple joints (HCC) 10/22/2010    Past Surgical History:  Procedure Laterality Date  . ABDOMINAL HYSTERECTOMY    . BLEPHAROPLASTY    . CARDIAC SURGERY    . CATARACT EXTRACTION       No current facility-administered medications for this encounter.   Current Outpatient Prescriptions:  .  aspirin EC 81 MG tablet, Take 1 tablet by mouth daily., Disp: , Rfl:  .  atorvastatin (LIPITOR) 10 MG tablet, Take 1 tablet by mouth daily., Disp: , Rfl:  .  carvedilol (COREG) 3.125 MG tablet, Take 2 tablets by mouth 2 (two) times daily. , Disp: , Rfl:  .  Cholecalciferol (VITAMIN D3) 5000 UNITS TABS, Take 1 tablet by mouth daily., Disp: , Rfl:  .  folic acid (FOLVITE) 1 MG tablet, Take 1 tablet by mouth daily., Disp: , Rfl:  .  methotrexate (RHEUMATREX) 2.5 MG tablet, Take 1 tablet by mouth daily., Disp: , Rfl:  .  Multiple Vitamins-Minerals (MULTIVITAMIN ADULT PO), Take 1 tablet by mouth daily., Disp: , Rfl:  .  potassium chloride SA (K-DUR,KLOR-CON) 20 MEQ tablet, Take 2 tablets by mouth daily., Disp: , Rfl:  .  traMADol (ULTRAM) 50 MG tablet, Take 1 tablet by mouth 2 (two) times daily., Disp: , Rfl:  .  triamcinolone cream (KENALOG) 0.1 %, , Disp: , Rfl:  .  triamterene-hydrochlorothiazide (DYAZIDE) 37.5-25 MG capsule, Take 1 capsule by mouth daily., Disp: , Rfl:  .  ZETIA 10 MG tablet, Take 1 tablet by mouth daily., Disp: , Rfl:  .  fluconazole (DIFLUCAN) 150 MG tablet, Take 1 tablet (150 mg total) by  mouth daily. Take one pill orally once as needed., Disp: 1 tablet, Rfl: 0 .  nitrofurantoin, macrocrystal-monohydrate, (MACROBID) 100 MG capsule, Take 1 capsule (100 mg total) by mouth 2 (two) times daily., Disp: 10 capsule, Rfl: 0 .  NONFORMULARY OR COMPOUNDED ITEM, [facility] compound:  Onychomycosis Nail Lacquer - Fluconazole 2%, Terbinafine 1%, DMSO, apply to the affected area daily.  +11Refills., Disp: 120 each, Rfl: 11 .  phenazopyridine (PYRIDIUM) 200 MG tablet, Take 1 tablet (200 mg total) by mouth 3 (three) times daily as needed for pain., Disp: 12 tablet, Rfl: 0 .  phenazopyridine (PYRIDIUM) 200 MG tablet, Take 1 tablet (200 mg total) by mouth 3 (three) times daily as needed for pain., Disp: 15 tablet, Rfl: 0 .  predniSONE (DELTASONE) 1 MG tablet, Take 2 tablets by mouth daily., Disp: , Rfl:  .  terbinafine (LAMISIL) 250 MG tablet, Take 1 tablet (250 mg total) by mouth daily., Disp: 90 tablet, Rfl: 0  Allergies Zocor [simvastatin]; Enalaprilat; Flexeril [cyclobenzaprine]; and Verapamil  Family History  Problem Relation Age of Onset  . Hypertension Mother   . Cancer Mother   . Glaucoma Mother   . Cancer Father   . Heart attack Sister   . Diabetes Brother   . Stroke Brother   . Glaucoma Brother     Social History Social History  Substance Use Topics  . Smoking status: Never Smoker  . Smokeless tobacco: Never Used  . Alcohol use No    Review of Systems Constitutional: No fever/chills Cardiovascular: Denies chest pain. Respiratory: Denies shortness of breath. Gastrointestinal: No abdominal pain.  No nausea, no vomiting.  No diarrhea.   Genitourinary: Positive for dysuria. Musculoskeletal: Negative for back pain. Skin: Negative for rash.  ____________________________________________   PHYSICAL EXAM:  VITAL SIGNS: ED Triage Vitals  Enc Vitals Group     BP 08/19/16 0815 (!) 175/74     Pulse Rate 08/19/16 0815 (!) 55     Resp 08/19/16 0815 16     Temp  08/19/16 0826 97.5 F (36.4 C)     Temp Source 08/19/16 0826 Oral     SpO2 08/19/16 0815 98 %     Weight 08/19/16 0820 178 lb (80.7 kg)     Height 08/19/16 0820 5\' 7"  (1.702 m)     Head Circumference --      Peak Flow --      Pain Score --      Pain Loc --      Pain Edu? --      Excl. in GC? --     Constitutional: Alert and oriented. Well appearing and in no acute distress. Cardiovascular: Normal rate, regular rhythm. Grossly normal heart sounds.  Good peripheral circulation. Respiratory: Normal respiratory effort without tachypnea nor retractions. Breath sounds are clear and equal bilaterally. No wheezes, rales, rhonchi. Gastrointestinal: Soft and nontender. No distention.No CVA tenderness. Musculoskeletal: No midline cervical, thoracic or lumbar tenderness to palpation.  Neurologic:  Normal speech and language.Speech is normal. No gait instability.  Skin:  Skin is warm, dry Psychiatric: Mood and affect are normal. Speech and behavior are normal. Patient exhibits appropriate insight and judgment  ___________________________________________   LABS (all labs ordered are listed, but only abnormal results are displayed)  Labs Reviewed  URINALYSIS, COMPLETE (UACMP) WITH MICROSCOPIC - Abnormal; Notable for the following:       Result Value   Color, Urine ORANGE (*)    APPearance CLOUDY (*)    Glucose, UA   (*)    Value: TEST NOT REPORTED DUE TO COLOR INTERFERENCE OF URINE PIGMENT   Hgb urine dipstick   (*)    Value: TEST NOT REPORTED DUE TO COLOR INTERFERENCE OF URINE PIGMENT   Bilirubin Urine   (*)    Value: TEST NOT REPORTED DUE TO COLOR INTERFERENCE OF URINE PIGMENT   Ketones, ur   (*)    Value: TEST NOT REPORTED DUE TO COLOR INTERFERENCE OF URINE PIGMENT   Protein, ur   (*)    Value: TEST NOT REPORTED DUE TO COLOR INTERFERENCE OF URINE PIGMENT   Nitrite   (*)    Value: TEST NOT REPORTED DUE TO COLOR INTERFERENCE OF URINE PIGMENT   Leukocytes, UA   (*)    Value: TEST NOT  REPORTED DUE TO COLOR INTERFERENCE OF URINE PIGMENT   Squamous Epithelial / LPF 0-5 (*)    Bacteria, UA FEW (*)    All other components within normal limits  URINE CULTURE    Urine culture from 06/01/2016 to reviewed and showing multiple resistances, sensitive to Cipro, Macrobid and cephalexin.  RADIOLOGY  No results found. ____________________________________________   PROCEDURES Procedures    INITIAL IMPRESSION / ASSESSMENT AND PLAN / ED COURSE  Pertinent labs & imaging results that were available during my care of the patient were reviewed by me and considered in my medical decision making (see chart for details).  Well-appearing patient. No acute distress. Presents for the complaints of urinary symptoms. Suspect urinary tract infection. Reviewed most recent culture showing sensitivity to Macrobid. Patient reports Macrobid worked well for her in past. Will treat with Macrobid twice a day 5 days. Encouraged rest, fluids and supportive care. Encourage PCP follow up in one week. Discussed indication, risks and benefits of medications with patient. Patient also requests Diflucan tablets history of yeast vaginitis from oral antibiotics, 1 tab given.   Discussed follow up and return parameters including no resolution or any worsening concerns. Patient verbalized understanding and agreed to plan.   ____________________________________________   FINAL CLINICAL IMPRESSION(S) / ED DIAGNOSES  Final diagnoses:  Urinary tract infection with hematuria, site unspecified     New Prescriptions   FLUCONAZOLE (DIFLUCAN) 150 MG TABLET    Take 1 tablet (150 mg total) by mouth daily. Take one pill orally once as needed.   NITROFURANTOIN, MACROCRYSTAL-MONOHYDRATE, (MACROBID) 100 MG CAPSULE    Take 1 capsule (100 mg total) by mouth 2 (two) times daily.    Note: This dictation was prepared with Dragon dictation along with smaller phrase technology. Any transcriptional errors that result from  this process are unintentional.         Renford Dills, NP 08/19/16 [phone]

## 2016-08-19 NOTE — ED Triage Notes (Signed)
Dysuria, frequency, and suprapubic abd pain since yesterday. Pt states frequent or recurrent UTIs recently.

## 2016-08-21 LAB — URINE CULTURE: Culture: >=100000 — AB

## 2016-09-23 IMAGING — CT CT HEAD W/O CM
1 series · 16 of 30 positions shown, 20 images · non-contrast
Comparison: None.

CLINICAL DATA: Altered mental status, acute onset. Initial
encounter.

EXAM:
CT HEAD WITHOUT CONTRAST
TECHNIQUE: Contiguous axial images were obtained from the base of the skull
through the vertex without intravenous contrast.

[Series 2: head wo · axial · 0.42mm/px · z∈[-155,-3]mm · 16 of 36 slices shown, 20 images]
[im 2/36  brain]
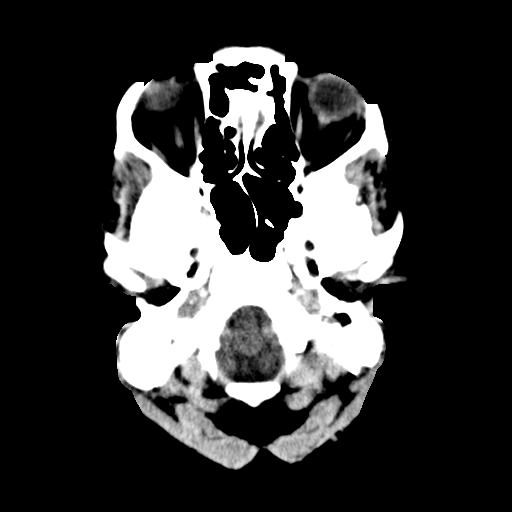
[im 2/36  bone]
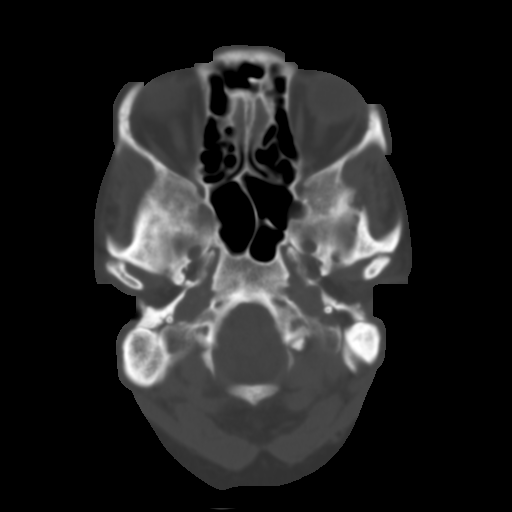
[im 4/36  brain]
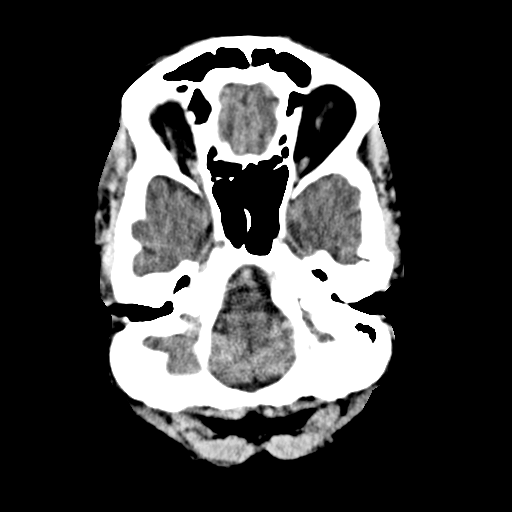
[im 7/36  brain]
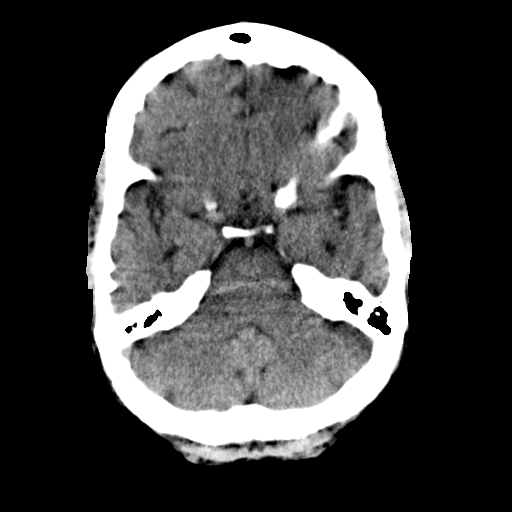
[im 9/36  brain]
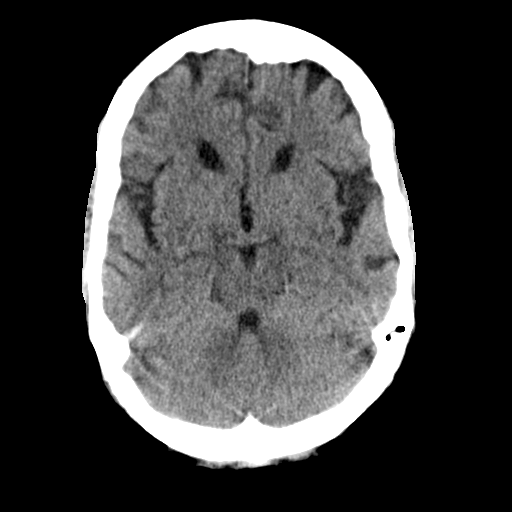
[im 10/36  brain]
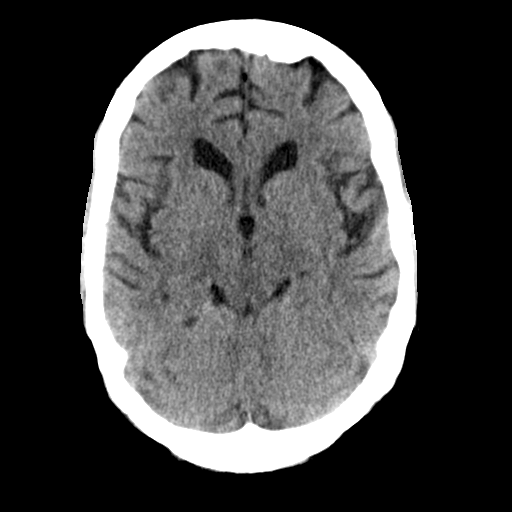
[im 10/36  bone]
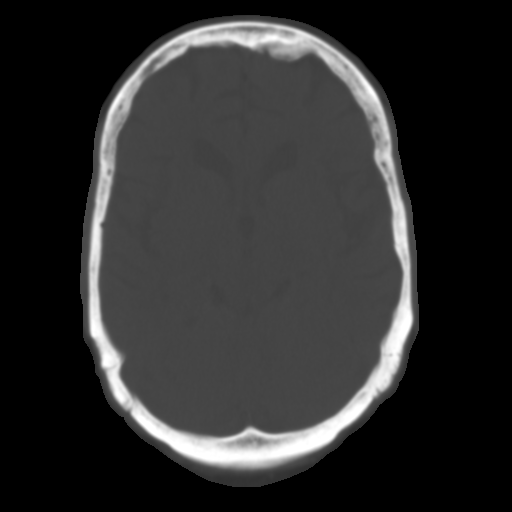
[im 13/36  brain]
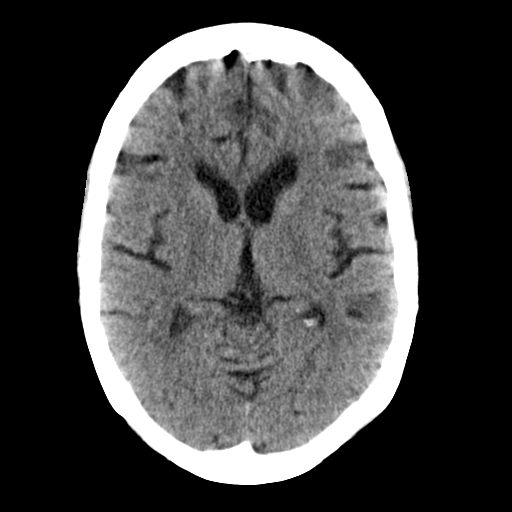
[im 15/36  brain]
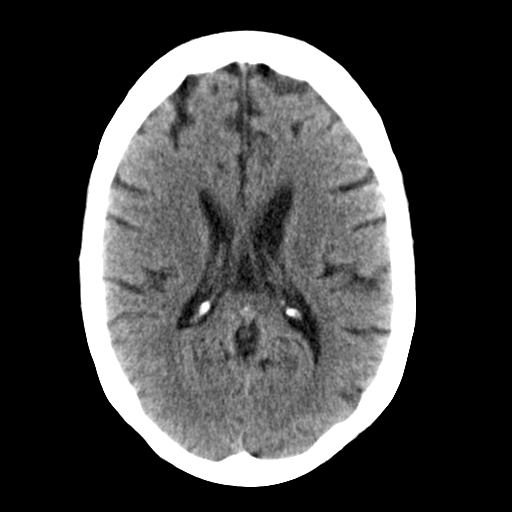
[im 17/36  brain]
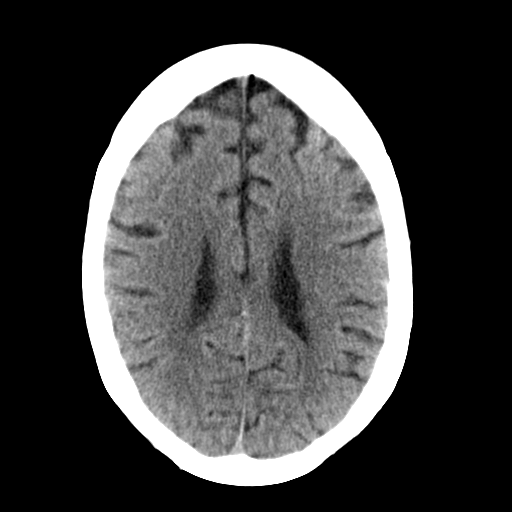
[im 19/36  brain]
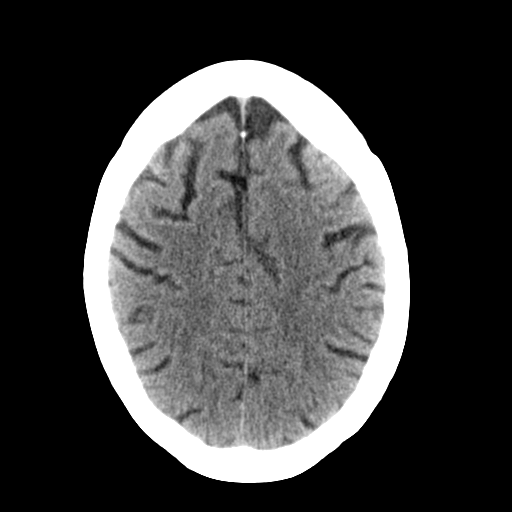
[im 19/36  bone]
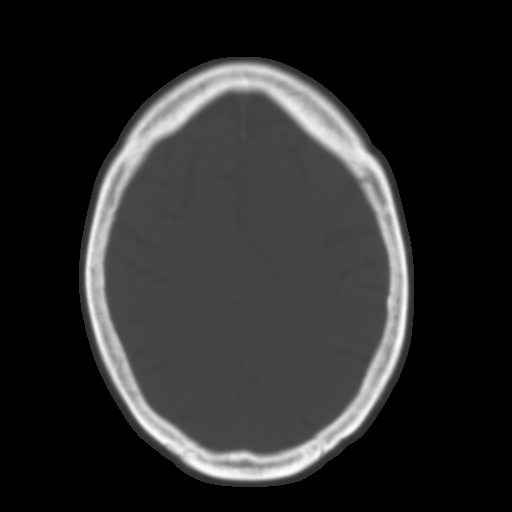
[im 21/36  brain]
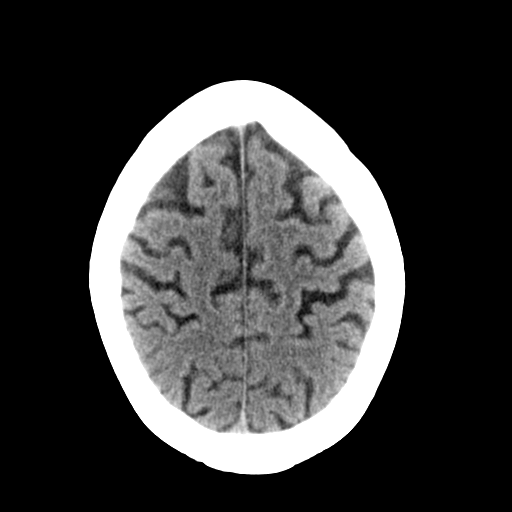
[im 23/36  brain]
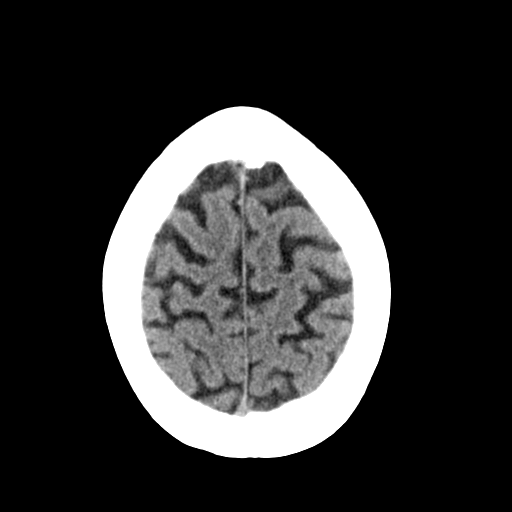
[im 26/36  brain]
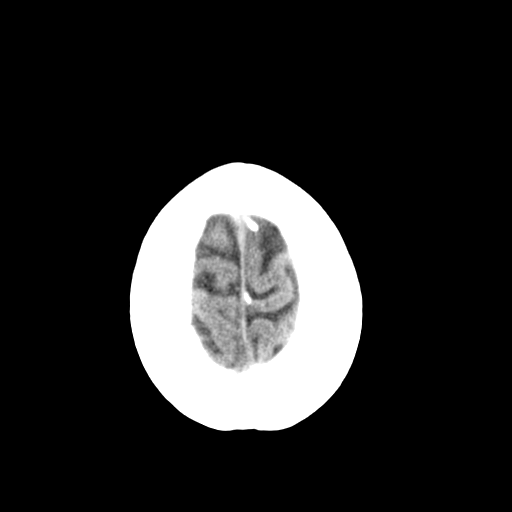
[im 27/36  brain]
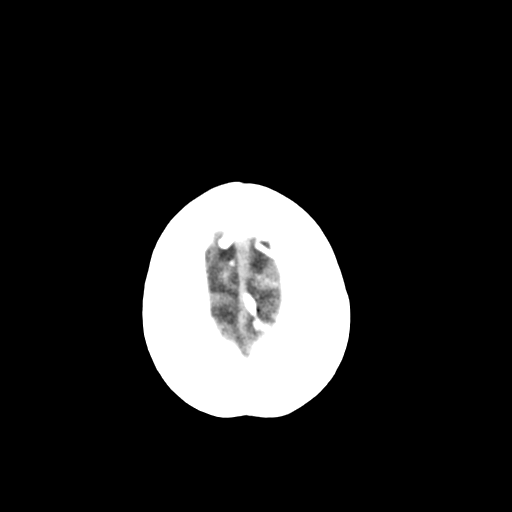
[im 27/36  bone]
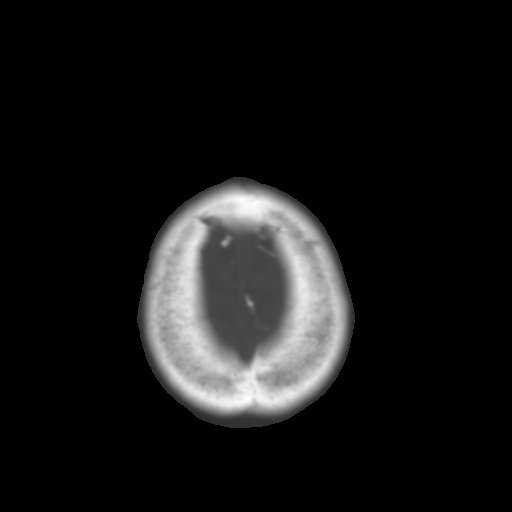
[im 29/36  brain]
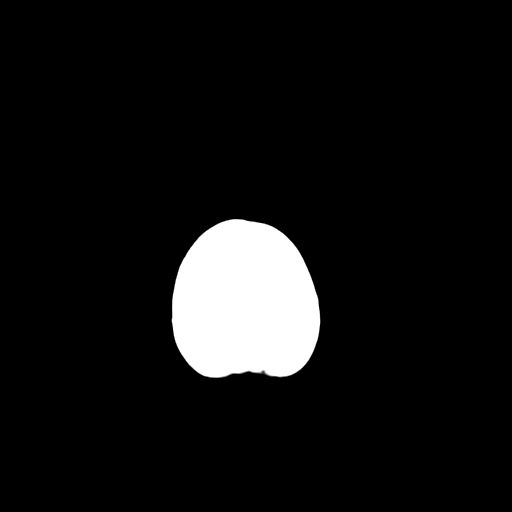
[im 32/36  brain]
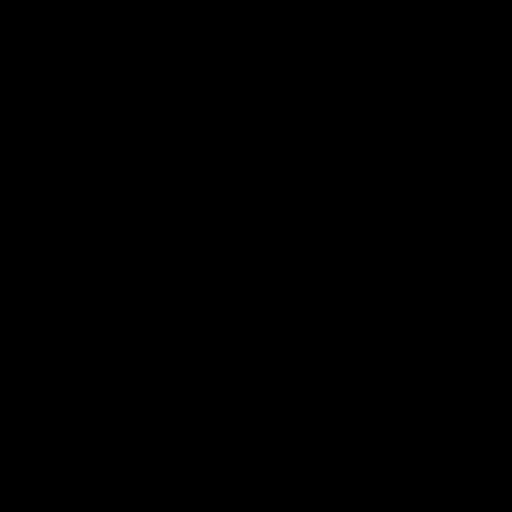
[im 34/36  brain]
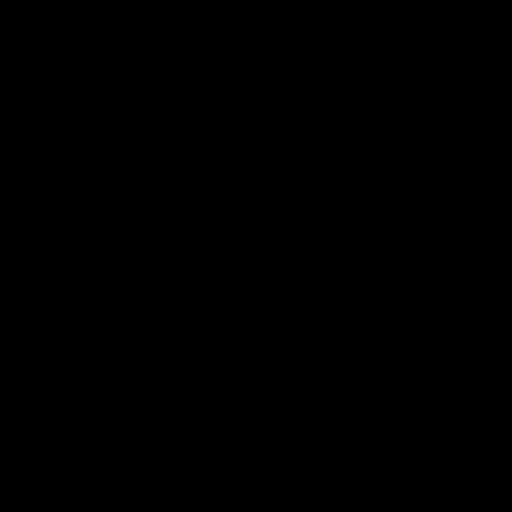

[16 of 30 positions shown; findings below may reference images not displayed]

FINDINGS: There is no evidence of acute infarction, mass lesion, or intra- or
extra-axial hemorrhage on CT.

The posterior fossa, including the cerebellum, brainstem and fourth
ventricle, is within normal limits. The third and lateral
ventricles, and basal ganglia are unremarkable in appearance. The
cerebral hemispheres are symmetric in appearance, with normal
gray-white differentiation. No mass effect or midline shift is seen.

There is no evidence of fracture; visualized osseous structures are
unremarkable in appearance. The visualized portions of the orbits
are within normal limits. The paranasal sinuses and mastoid air
cells are well-aerated. No significant soft tissue abnormalities are
seen.
IMPRESSION: Unremarkable noncontrast CT of the head.

## 2016-09-23 IMAGING — MR MR MRA HEAD W/O CM
9 of 10 series · 41 of 48 positions shown · non-contrast
Comparison: CT head earlier today.

CLINICAL DATA: Acute encephalopathy. Confusion. Patient is febrile,
possible sepsis. History of diabetes. History of hyperlipidemia and
hypertension.

EXAM:
MRI HEAD WITHOUT CONTRAST
MRA HEAD WITHOUT CONTRAST
TECHNIQUE: Multiplanar, multiecho pulse sequences of the brain and surrounding
structures were obtained without intravenous contrast. Angiographic
images of the head were obtained using MRA technique without
contrast.

[Series 2: T1 · sagittal · 5.0mm · 0.45mm/px · 3 of 27 slices shown]
[im 1/27]
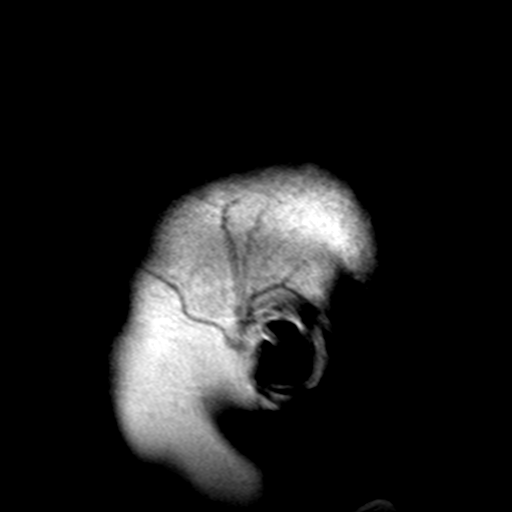
[im 14/27]
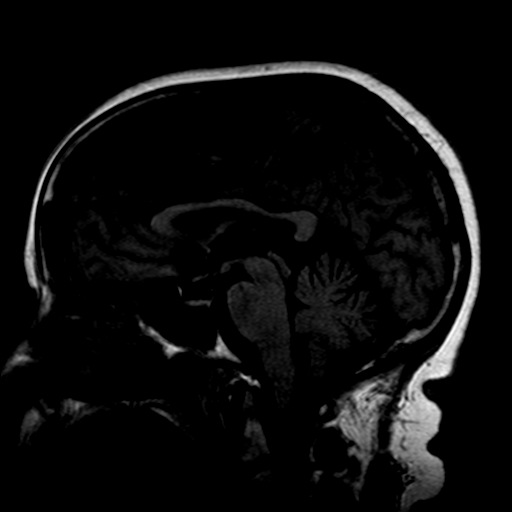
[im 27/27]
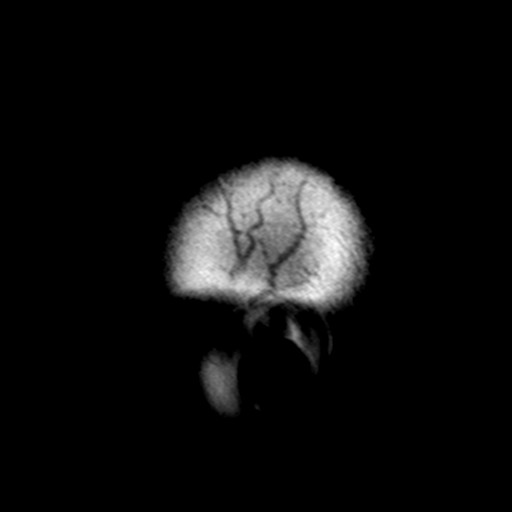

[Series 4: DWI · axial · 4.0mm · 0.94mm/px · z∈[-94,+73]mm · 6 of 43 slices shown (1 of 4)]
[im 1/43]
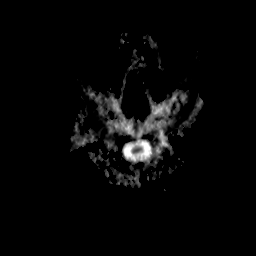
[im 9/43]
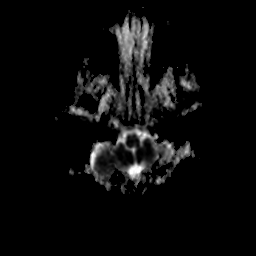
[im 17/43]
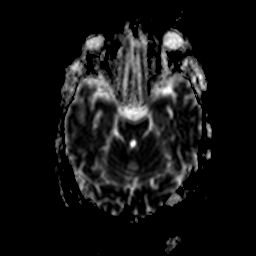
[im 26/43]
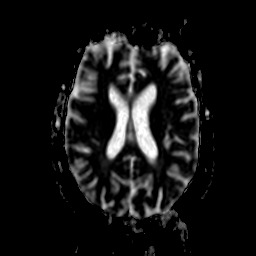
[im 34/43]
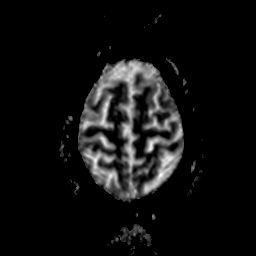
[im 43/43]
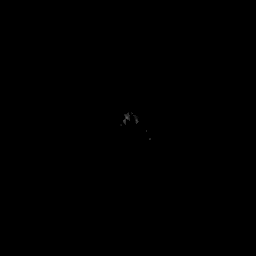

[Series 6: DWI · coronal · 5.0mm · 1.80mm/px · 5 of 41 slices shown (2 of 4)]
[im 1/41]
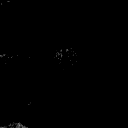
[im 11/41]
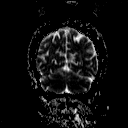
[im 21/41]
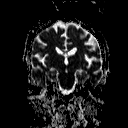
[im 31/41]
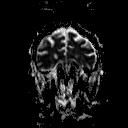
[im 41/41]
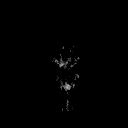

[Series 8: FLAIR · axial · 5.0mm · 0.90mm/px · z∈[-92,+76]mm · 4 of 27 slices shown]
[im 1/27]
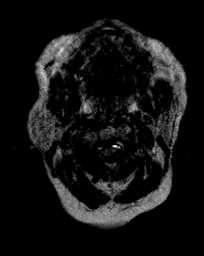
[im 9/27]
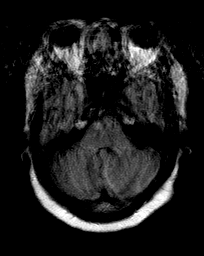
[im 18/27]
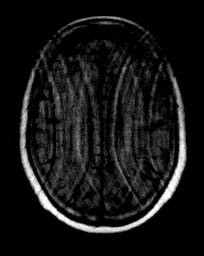
[im 27/27]
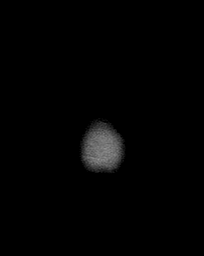

[Series 9: T2 · axial · 5.0mm · 0.45mm/px · z∈[-92,+76]mm · 4 of 27 slices shown (1 of 3)]
[im 1/27]
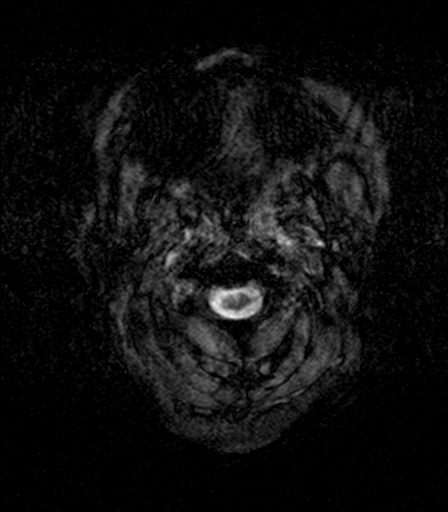
[im 9/27]
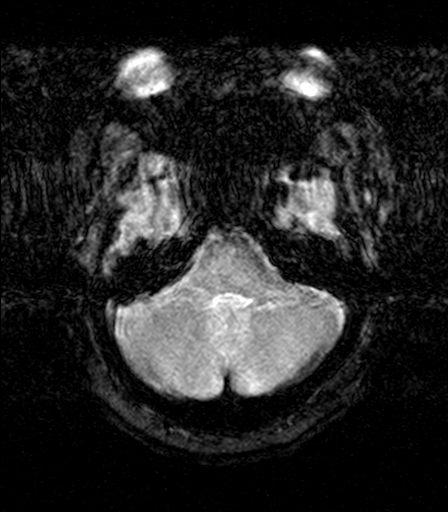
[im 18/27]
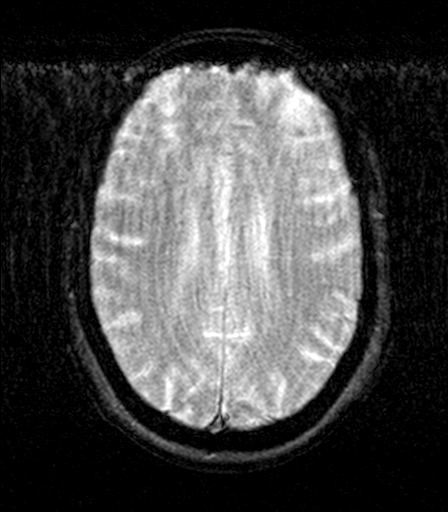
[im 27/27]
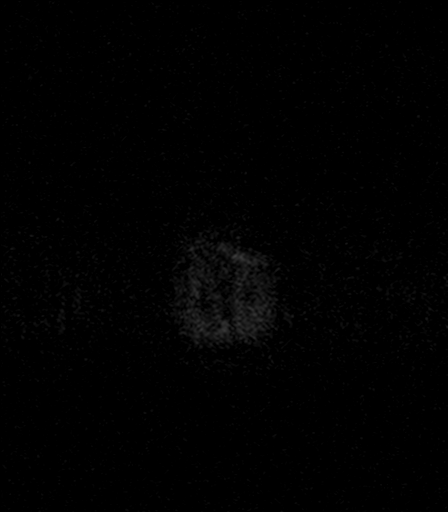

[Series 10: DWI · axial · 4.0mm · 0.94mm/px · z∈[-94,+73]mm · 6 of 43 slices shown (3 of 4)]
[im 1/43]
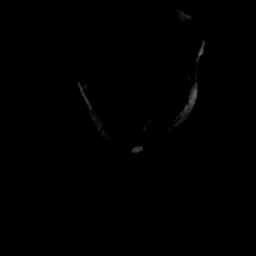
[im 9/43]
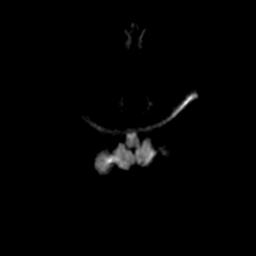
[im 17/43]
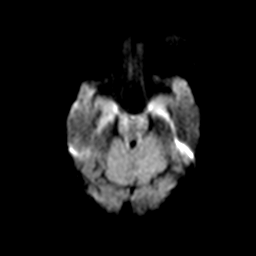
[im 26/43]
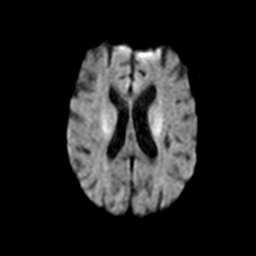
[im 34/43]
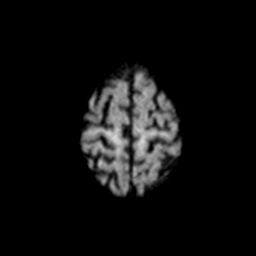
[im 43/43]
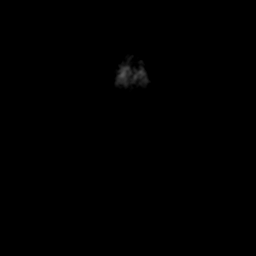

[Series 11: T2 · coronal · 5.0mm · 0.51mm/px · 4 of 33 slices shown (2 of 3)]
[im 1/33]
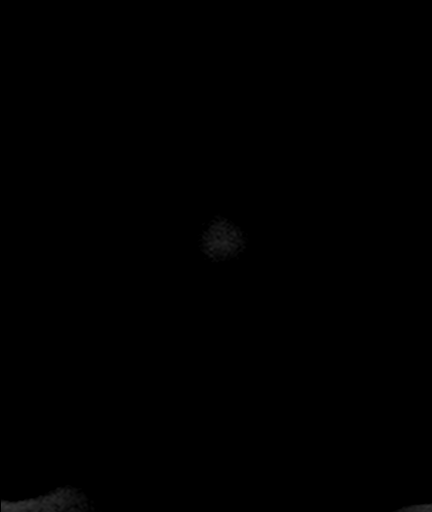
[im 11/33]
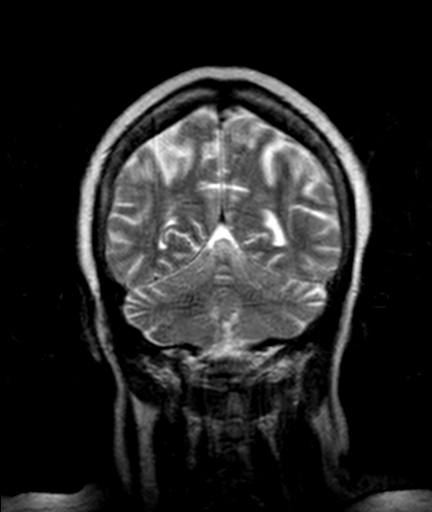
[im 22/33]
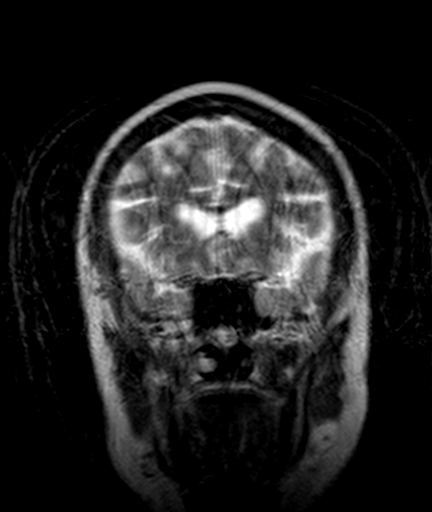
[im 33/33]
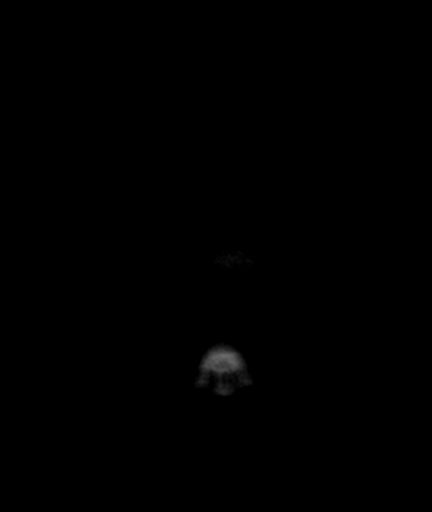

[Series 12: DWI · coronal · 5.0mm · 1.80mm/px · 5 of 38 slices shown (4 of 4)]
[im 1/38]
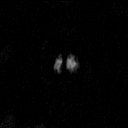
[im 10/38]
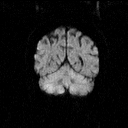
[im 19/38]
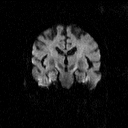
[im 28/38]
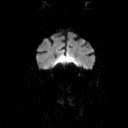
[im 38/38]
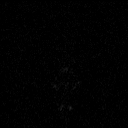

[Series 13: T2 · axial · 5.0mm · 0.68mm/px · z∈[-90,+78]mm · 4 of 27 slices shown (3 of 3)]
[im 1/27]
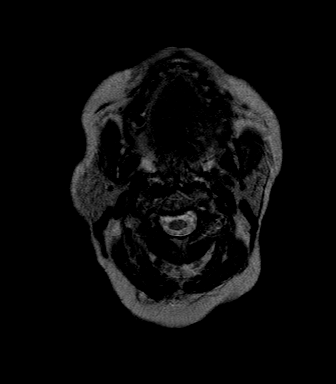
[im 9/27]
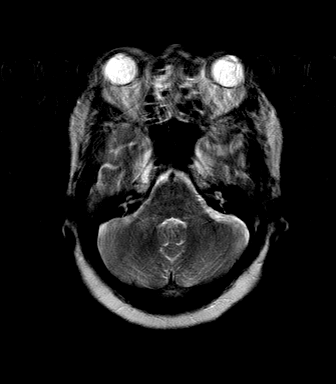
[im 18/27]
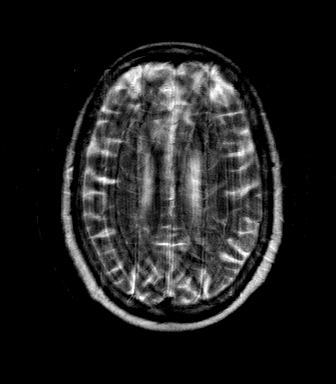
[im 27/27]
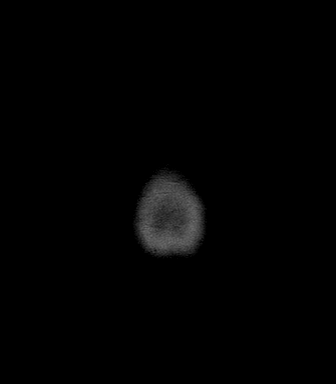

[41 of 48 positions shown; findings below may reference images not displayed]

FINDINGS: The patient was unable to remain motionless for the exam. Small or
subtle lesions could be overlooked.

MRI HEAD FINDINGS

No visible restricted diffusion. No hemorrhage, mass lesion,
hydrocephalus, or extra-axial fluid. Generalized atrophy. Mild
subcortical and periventricular T2 and FLAIR hyperintensities,
likely chronic microvascular ischemic change. Pituitary and
cerebellar tonsils unremarkable. Cervical spondylosis with suspected
disc osteophyte complex at C3-4. No visible foci of chronic
hemorrhage. Flow voids are maintained. Extracranial soft tissues
grossly unremarkable.

Compared with prior CT, good general agreement.

MRA HEAD FINDINGS

Motion degraded exam. BILATERAL cavernous carotid narrowing
inferiorly is felt to be artifactual, but focal stenoses are not
excluded. Supraclinoid ICAs widely patent. Basilar artery widely
patent with vertebrals codominant.

Unremarkable proximal anterior and middle cerebral artery on the
RIGHT. Mildly irregular proximal LEFT middle cerebral artery.
Severely diseased LEFT A1 ACA. LEFT PCA demonstrates a mixed
fetal/native origin from the basilar, with focal narrowing at the
proximal LEFT P2 segment potentially flow reducing. Mild
irregularity of the distal MCA and PCA branches suggesting
intracranial atherosclerotic change. No intracranial aneurysm.
IMPRESSION: Motion degraded MRI brain exam demonstrating atrophy and small
vessel disease without definite acute intracranial findings.

Motion degraded MRA with suspected artifactual narrowing of the
cavernous carotid arteries bilaterally. Moderately diseased LEFT
anterior cerebral artery proximally.

## 2016-10-20 ENCOUNTER — Observation Stay
Admission: EM | Admit: 2016-10-20 | Discharge: 2016-10-21 | Disposition: A | Discharge status: Home / Self Care | Payer: Medicare Other | Attending: Internal Medicine | Admitting: Internal Medicine

## 2016-10-20 ENCOUNTER — Emergency Department: Payer: Medicare Other

## 2016-10-20 ENCOUNTER — Observation Stay: Payer: Medicare Other

## 2016-10-20 DIAGNOSIS — Z7982 Long term (current) use of aspirin: Secondary | ICD-10-CM | POA: Insufficient documentation

## 2016-10-20 DIAGNOSIS — G9340 Encephalopathy, unspecified: Secondary | ICD-10-CM | POA: Insufficient documentation

## 2016-10-20 DIAGNOSIS — I2510 Atherosclerotic heart disease of native coronary artery without angina pectoris: Secondary | ICD-10-CM | POA: Insufficient documentation

## 2016-10-20 DIAGNOSIS — E669 Obesity, unspecified: Secondary | ICD-10-CM | POA: Insufficient documentation

## 2016-10-20 DIAGNOSIS — N390 Urinary tract infection, site not specified: Secondary | ICD-10-CM | POA: Insufficient documentation

## 2016-10-20 DIAGNOSIS — I130 Hypertensive heart and chronic kidney disease with heart failure and stage 1 through stage 4 chronic kidney disease, or unspecified chronic kidney disease: Secondary | ICD-10-CM | POA: Diagnosis not present

## 2016-10-20 DIAGNOSIS — R4182 Altered mental status, unspecified: Secondary | ICD-10-CM | POA: Diagnosis present

## 2016-10-20 DIAGNOSIS — Z6829 Body mass index (BMI) 29.0-29.9, adult: Secondary | ICD-10-CM | POA: Diagnosis not present

## 2016-10-20 DIAGNOSIS — M160 Bilateral primary osteoarthritis of hip: Secondary | ICD-10-CM | POA: Insufficient documentation

## 2016-10-20 DIAGNOSIS — E860 Dehydration: Secondary | ICD-10-CM | POA: Diagnosis not present

## 2016-10-20 DIAGNOSIS — E1165 Type 2 diabetes mellitus with hyperglycemia: Secondary | ICD-10-CM | POA: Diagnosis not present

## 2016-10-20 DIAGNOSIS — M069 Rheumatoid arthritis, unspecified: Secondary | ICD-10-CM | POA: Insufficient documentation

## 2016-10-20 DIAGNOSIS — E871 Hypo-osmolality and hyponatremia: Secondary | ICD-10-CM

## 2016-10-20 DIAGNOSIS — E1122 Type 2 diabetes mellitus with diabetic chronic kidney disease: Secondary | ICD-10-CM | POA: Insufficient documentation

## 2016-10-20 DIAGNOSIS — E7800 Pure hypercholesterolemia, unspecified: Secondary | ICD-10-CM | POA: Insufficient documentation

## 2016-10-20 DIAGNOSIS — R739 Hyperglycemia, unspecified: Secondary | ICD-10-CM

## 2016-10-20 DIAGNOSIS — N289 Disorder of kidney and ureter, unspecified: Secondary | ICD-10-CM

## 2016-10-20 DIAGNOSIS — R195 Other fecal abnormalities: Secondary | ICD-10-CM

## 2016-10-20 DIAGNOSIS — R410 Disorientation, unspecified: Secondary | ICD-10-CM | POA: Diagnosis present

## 2016-10-20 DIAGNOSIS — R8281 Pyuria: Secondary | ICD-10-CM

## 2016-10-20 DIAGNOSIS — Z79899 Other long term (current) drug therapy: Secondary | ICD-10-CM | POA: Diagnosis not present

## 2016-10-20 DIAGNOSIS — Z955 Presence of coronary angioplasty implant and graft: Secondary | ICD-10-CM | POA: Insufficient documentation

## 2016-10-20 DIAGNOSIS — E039 Hypothyroidism, unspecified: Secondary | ICD-10-CM | POA: Diagnosis not present

## 2016-10-20 DIAGNOSIS — N182 Chronic kidney disease, stage 2 (mild): Secondary | ICD-10-CM

## 2016-10-20 DIAGNOSIS — N189 Chronic kidney disease, unspecified: Secondary | ICD-10-CM

## 2016-10-20 DIAGNOSIS — N179 Acute kidney failure, unspecified: Secondary | ICD-10-CM | POA: Diagnosis not present

## 2016-10-20 DIAGNOSIS — I509 Heart failure, unspecified: Secondary | ICD-10-CM | POA: Diagnosis not present

## 2016-10-20 LAB — URINALYSIS, COMPLETE (UACMP) WITH MICROSCOPIC
BILIRUBIN URINE: NEGATIVE
Bacteria, UA: NONE SEEN
Glucose, UA: NEGATIVE mg/dL
Ketones, ur: 5 mg/dL — AB
Nitrite: NEGATIVE
PH: 6 (ref 5.0–8.0)
Protein, ur: NEGATIVE mg/dL
SPECIFIC GRAVITY, URINE: 1.016 (ref 1.005–1.030)

## 2016-10-20 LAB — CBC
HEMATOCRIT: 44.7 % (ref 35.0–47.0)
Hemoglobin: 14.6 g/dL (ref 12.0–16.0)
MCH: 27.8 pg (ref 26.0–34.0)
MCHC: 32.7 g/dL (ref 32.0–36.0)
MCV: 85 fL (ref 80.0–100.0)
PLATELETS: 184 10*3/uL (ref 150–440)
RBC: 5.25 MIL/uL — ABNORMAL HIGH (ref 3.80–5.20)
RDW: 14.2 % (ref 11.5–14.5)
WBC: 5.1 10*3/uL (ref 3.6–11.0)

## 2016-10-20 LAB — TROPONIN I: Troponin I: <0.03 ng/mL (ref <0.03)

## 2016-10-20 LAB — COMPREHENSIVE METABOLIC PANEL
ALBUMIN: 3.8 g/dL (ref 3.5–5.0)
ALT: 24 U/L (ref 14–54)
ANION GAP: 11 (ref 5–15)
AST: 35 U/L (ref 15–41)
Alkaline Phosphatase: 85 U/L (ref 38–126)
BILIRUBIN TOTAL: 1.2 mg/dL (ref 0.3–1.2)
BUN: 17 mg/dL (ref 6–20)
CO2: 24 mmol/L (ref 22–32)
Calcium: 9 mg/dL (ref 8.9–10.3)
Chloride: 96 mmol/L — ABNORMAL LOW (ref 101–111)
Creatinine, Ser: 1.18 mg/dL — ABNORMAL HIGH (ref 0.44–1.00)
GFR calc Af Amer: 50 mL/min — ABNORMAL LOW (ref >60)
GFR, EST NON AFRICAN AMERICAN: 43 mL/min — AB (ref >60)
GLUCOSE: 125 mg/dL — AB (ref 65–99)
POTASSIUM: 4 mmol/L (ref 3.5–5.1)
Sodium: 131 mmol/L — ABNORMAL LOW (ref 135–145)
TOTAL PROTEIN: 7.9 g/dL (ref 6.5–8.1)

## 2016-10-20 LAB — HEMOGLOBIN A1C
Hgb A1c MFr Bld: 6 % — ABNORMAL HIGH (ref 4.8–5.6)
Mean Plasma Glucose: 125.5 mg/dL

## 2016-10-20 LAB — TSH: TSH: 2.395 u[IU]/mL (ref 0.350–4.500)

## 2016-10-20 LAB — AMMONIA

## 2016-10-20 MED ORDER — METHOTREXATE 2.5 MG PO TABS: 17.5000 mg | ORAL_TABLET | ORAL | Status: DC

## 2016-10-20 MED ORDER — TRIAMTERENE-HCTZ 37.5-25 MG PO CAPS
1.0000 | ORAL_CAPSULE | Freq: Every day | ORAL | Status: DC
  Filled 2016-10-20: qty 1

## 2016-10-20 MED ORDER — CARVEDILOL 3.125 MG PO TABS
6.2500 mg | ORAL_TABLET | Freq: Two times a day (BID) | ORAL | Status: DC
  Administered 2016-10-20 – 2016-10-21 (×2): 6.25 mg via ORAL
  Filled 2016-10-20 (×2): qty 2

## 2016-10-20 MED ORDER — FLUCONAZOLE 50 MG PO TABS
150.0000 mg | ORAL_TABLET | Freq: Once | ORAL | Status: AC
  Administered 2016-10-20: 150 mg via ORAL
  Filled 2016-10-20: qty 1

## 2016-10-20 MED ORDER — ACETAMINOPHEN 325 MG PO TABS
650.0000 mg | ORAL_TABLET | Freq: Four times a day (QID) | ORAL | Status: DC | PRN
  Administered 2016-10-20: 650 mg via ORAL
  Filled 2016-10-20: qty 2

## 2016-10-20 MED ORDER — FLUCONAZOLE 150 MG PO TABS: 150.0000 mg | ORAL_TABLET | Freq: Every day | ORAL | Status: DC

## 2016-10-20 MED ORDER — TRIAMTERENE-HCTZ 37.5-25 MG PO TABS
1.0000 | ORAL_TABLET | Freq: Every day | ORAL | Status: DC
  Administered 2016-10-20: 13:00:00 1 via ORAL
  Filled 2016-10-20: qty 1

## 2016-10-20 MED ORDER — SODIUM CHLORIDE 0.9 % IV SOLN
INTRAVENOUS | Status: DC
  Administered 2016-10-20 (×2): via INTRAVENOUS

## 2016-10-20 MED ORDER — CEPHALEXIN 250 MG/5ML PO SUSR
500.0000 mg | Freq: Two times a day (BID) | ORAL | Status: DC
  Administered 2016-10-20 – 2016-10-21 (×3): 500 mg via ORAL
  Filled 2016-10-20 (×4): qty 10

## 2016-10-20 MED ORDER — ACETAMINOPHEN 325 MG PO TABS: 650.0000 mg | ORAL_TABLET | Freq: Four times a day (QID) | ORAL | Status: DC | PRN

## 2016-10-20 MED ORDER — PREDNISONE 1 MG PO TABS
2.0000 mg | ORAL_TABLET | Freq: Every day | ORAL | Status: DC
  Filled 2016-10-20: qty 2

## 2016-10-20 MED ORDER — ACETAMINOPHEN 650 MG RE SUPP: 650.0000 mg | Freq: Four times a day (QID) | RECTAL | Status: DC | PRN

## 2016-10-20 MED ORDER — ONDANSETRON HCL 4 MG/2ML IJ SOLN
4.0000 mg | Freq: Four times a day (QID) | INTRAMUSCULAR | Status: DC | PRN
  Administered 2016-10-20: 08:00:00 4 mg via INTRAVENOUS
  Filled 2016-10-20: qty 2

## 2016-10-20 MED ORDER — ENOXAPARIN SODIUM 40 MG/0.4ML ~~LOC~~ SOLN
40.0000 mg | SUBCUTANEOUS | Status: DC
  Administered 2016-10-20: 40 mg via SUBCUTANEOUS
  Filled 2016-10-20: qty 0.4

## 2016-10-20 MED ORDER — ONDANSETRON HCL 4 MG PO TABS: 4.0000 mg | ORAL_TABLET | Freq: Four times a day (QID) | ORAL | Status: DC | PRN

## 2016-10-20 MED ORDER — POTASSIUM CHLORIDE CRYS ER 20 MEQ PO TBCR
40.0000 meq | EXTENDED_RELEASE_TABLET | Freq: Every day | ORAL | Status: DC
  Administered 2016-10-20 – 2016-10-21 (×2): 40 meq via ORAL
  Filled 2016-10-20 (×2): qty 2

## 2016-10-20 MED ORDER — VITAMIN D3 25 MCG (1000 UNIT) PO TABS
5000.0000 [IU] | ORAL_TABLET | Freq: Every day | ORAL | Status: DC
  Administered 2016-10-20 – 2016-10-21 (×2): 5000 [IU] via ORAL
  Filled 2016-10-20 (×4): qty 5

## 2016-10-20 MED ORDER — ASPIRIN EC 81 MG PO TBEC
81.0000 mg | DELAYED_RELEASE_TABLET | Freq: Every day | ORAL | Status: DC
  Administered 2016-10-20 – 2016-10-21 (×2): 81 mg via ORAL
  Filled 2016-10-20 (×2): qty 1

## 2016-10-20 MED ORDER — GADOBENATE DIMEGLUMINE 529 MG/ML IV SOLN
20.0000 mL | Freq: Once | INTRAVENOUS | Status: AC | PRN
  Administered 2016-10-20: 16 mL via INTRAVENOUS

## 2016-10-20 MED ORDER — FOLIC ACID 1 MG PO TABS
1.0000 mg | ORAL_TABLET | Freq: Every day | ORAL | Status: DC
  Administered 2016-10-20 – 2016-10-21 (×2): 1 mg via ORAL
  Filled 2016-10-20 (×2): qty 1

## 2016-10-20 MED ORDER — METHOTREXATE 2.5 MG PO TABS
2.5000 mg | ORAL_TABLET | Freq: Every day | ORAL | Status: DC
  Filled 2016-10-20: qty 1

## 2016-10-20 MED ORDER — SODIUM CHLORIDE 0.9 % IV BOLUS (SEPSIS)
1000.0000 mL | Freq: Once | INTRAVENOUS | Status: AC
  Administered 2016-10-20: 1000 mL via INTRAVENOUS

## 2016-10-20 MED ORDER — PHENAZOPYRIDINE HCL 200 MG PO TABS
200.0000 mg | ORAL_TABLET | Freq: Three times a day (TID) | ORAL | Status: DC | PRN
  Filled 2016-10-20: qty 1

## 2016-10-20 MED ORDER — DOCUSATE SODIUM 100 MG PO CAPS
100.0000 mg | ORAL_CAPSULE | Freq: Two times a day (BID) | ORAL | Status: DC
  Administered 2016-10-20: 100 mg via ORAL
  Filled 2016-10-20 (×3): qty 1

## 2016-10-20 MED ORDER — EZETIMIBE 10 MG PO TABS
10.0000 mg | ORAL_TABLET | Freq: Every day | ORAL | Status: DC
  Administered 2016-10-20 – 2016-10-21 (×2): 10 mg via ORAL
  Filled 2016-10-20 (×2): qty 1

## 2016-10-20 MED ORDER — ATORVASTATIN CALCIUM 20 MG PO TABS
10.0000 mg | ORAL_TABLET | Freq: Every day | ORAL | Status: DC
  Administered 2016-10-20: 08:00:00 10 mg via ORAL
  Filled 2016-10-20 (×2): qty 1

## 2016-10-20 NOTE — Progress Notes (Signed)
[facility] - [place] [facility]   PATIENT NAME: Tammy Obrien    MR#:  [id number]  DATE OF BIRTH:  July 31, 1940  SUBJECTIVE:  CHIEF COMPLAINT:   Chief Complaint  Patient presents with  . Altered Mental Status   The patient is 76 year old [place] again female with past medical history significant for history of hypothyroidism, hypertension, CHF, coronary artery disease, who presented to the hospital with complaints of altered mental status, she had difficulty finding words. She was admitted to the hospital for evaluation and treatment. She denies any significant problems, feels good. She was noted to have elevated white blood cell count on urinalysis, felt to be urinary tract infection, initiated on antibiotic therapy, given one dose of Keflex after which she developed diarrhea. Gastrointestinal panel, C. difficile is pending  Review of Systems  Constitutional: Negative for chills, fever and weight loss.  HENT: Negative for congestion.   Eyes: Negative for blurred vision and double vision.  Respiratory: Negative for cough, sputum production, shortness of breath and wheezing.   Cardiovascular: Negative for chest pain, palpitations, orthopnea, leg swelling and PND.  Gastrointestinal: Negative for abdominal pain, blood in stool, constipation, diarrhea, nausea and vomiting.  Genitourinary: Negative for dysuria, frequency, hematuria and urgency.  Musculoskeletal: Negative for falls.  Neurological: Negative for dizziness, tremors, focal weakness and headaches.  Endo/Heme/Allergies: Does not bruise/bleed easily.  Psychiatric/Behavioral: Negative for depression. The patient does not have insomnia.     VITAL SIGNS: Blood pressure 138/72, pulse 62, temperature 98.5 F (36.9 C), temperature source Oral, resp. rate 16, height 5' 6.5" (1.689 m), weight 80.3 kg (177 lb 1.6 oz), SpO2 99 %.  PHYSICAL EXAMINATION:   GENERAL:  76 y.o.-year-old patient lying in the bed  with no acute distress.  EYES: Pupils equal, round, reactive to light and accommodation. No scleral icterus. Extraocular muscles intact.  HEENT: Head atraumatic, normocephalic. Oropharynx and nasopharynx clear.  NECK:  Supple, no jugular venous distention. No thyroid enlargement, no tenderness.  LUNGS: Normal breath sounds bilaterally, no wheezing, rales,rhonchi or crepitation. No use of accessory muscles of respiration.  CARDIOVASCULAR: S1, S2 normal. No murmurs, rubs, or gallops.  ABDOMEN: Soft, nontender, nondistended. Bowel sounds present. No organomegaly or mass.  EXTREMITIES: No pedal edema, cyanosis, or clubbing.  NEUROLOGIC: Cranial nerves II through XII are intact. Muscle strength 5/5 in all extremities. Sensation intact. Gait not checked.  PSYCHIATRIC: The patient is alert and oriented x 3.  SKIN: No obvious rash, lesion, or ulcer.   ORDERS/RESULTS REVIEWED:   CBC  Recent Labs Lab 10/20/16 0235  WBC 5.1  HGB 14.6  HCT 44.7  PLT 184  MCV 85.0  MCH 27.8  MCHC 32.7  RDW 14.2   ------------------------------------------------------------------------------------------------------------------  Chemistries   Recent Labs Lab 10/20/16 0235  NA 131*  K 4.0  CL 96*  CO2 24  GLUCOSE 125*  BUN 17  CREATININE 1.18*  CALCIUM 9.0  AST 35  ALT 24  ALKPHOS 85  BILITOT 1.2   ------------------------------------------------------------------------------------------------------------------ estimated creatinine clearance is 43.1 mL/min (A) (by C-G formula based on SCr of 1.18 mg/dL (H)). ------------------------------------------------------------------------------------------------------------------  Recent Labs  10/20/16 0235  TSH 2.395    Cardiac Enzymes  Recent Labs Lab 10/20/16 0235  TROPONINI <0.03   ------------------------------------------------------------------------------------------------------------------ Invalid input(s):  POCBNP ---------------------------------------------------------------------------------------------------------------  RADIOLOGY: Ct Head Wo Contrast  Result Date: 10/20/2016 CLINICAL DATA:  Altered mental status.  Frontal headache. EXAM: CT HEAD WITHOUT CONTRAST TECHNIQUE: Contiguous axial images were obtained from the base of the  skull through the vertex without intravenous contrast. COMPARISON:  Head CT and brain MRI 12/27/2014 FINDINGS: Brain: Stable atrophy and chronic small vessel ischemia No intracranial hemorrhage, mass effect, or midline shift. No hydrocephalus. The basilar cisterns are patent. No evidence of territorial infarct or acute ischemia. No extra-axial or intracranial fluid collection. Vascular: No hyperdense vessel. Skull: No skull fracture or focal lesion. Sinuses/Orbits: Paranasal sinuses and mastoid air cells are clear. The visualized orbits are unremarkable. Other: None. IMPRESSION: No acute intracranial abnormality. Electronically Signed   By: Rubye Oaks M.D.   On: 10/20/2016 03:08   Mr Brain W And Wo Contrast  Result Date: 10/20/2016 CLINICAL DATA:  Altered level of consciousness. Frontal headache. Confusion. EXAM: MRI HEAD WITHOUT AND WITH CONTRAST TECHNIQUE: Multiplanar, multiecho pulse sequences of the brain and surrounding structures were obtained without and with intravenous contrast. CONTRAST:  68mL MULTIHANCE GADOBENATE DIMEGLUMINE 529 MG/ML IV SOLN COMPARISON:  Head CT from earlier today.  Brain MRI 12/27/2014 FINDINGS: Brain: No acute infarction, hemorrhage, hydrocephalus, extra-axial collection or mass lesion. There is generalized cerebral volume loss and chronic microvascular ischemic change in the cerebral white matter, mild for age. No abnormal intracranial enhancement. Vascular: Major vessels are patent. Skull and upper cervical spine: Negative for marrow lesion Sinuses/Orbits: Bilateral cataract resection. Nonspecific divergent gaze. Essentially negative  paranasal sinuses. IMPRESSION: Senescent changes without new or acute finding. Electronically Signed   By: Marnee Spring M.D.   On: 10/20/2016 07:36    EKG:  Orders placed or performed during the hospital encounter of 10/20/16  . EKG 12-Lead  . EKG 12-Lead  . EKG 12-Lead  . EKG 12-Lead    ASSESSMENT AND PLAN:  Active Problems:   Confusion  #1. Confusion, unclear etiology at this time, clear at present, brain MRI was unremarkable, no acute changes, supportive therapy #2. Hyponatremia, continue IV fluids, follow sodium level in the morning, likely dehydration, patient admits of poor by mouth intake, now she has also diarrhea #3. Acute on chronic renal insufficiency, urinalysis, concerning for possible allergic infection, initiate patient on Keflex, after urine cultures were taken, follow urinary cultures #4. Hyperglycemia, get hemoglobin A1c #5. Urinary tract infection, cultures are taken, patient is initiated on Keflex #6 diarrhea, C. difficile, gastrointestinal panel is pending  Management plans discussed with the patient, family and they are in agreement.   DRUG ALLERGIES:  Allergies  Allergen Reactions  . Zocor [Simvastatin] Itching and Other (See Comments)    Other Reaction: muscle aches, weakness  . Enalaprilat Other (See Comments)  . Flexeril [Cyclobenzaprine] Itching and Other (See Comments)    Other Reaction: URTICARIA  . Verapamil Itching    CODE STATUS:     Code Status Orders        Start     Ordered   10/20/16 0736  Full code  Continuous     10/20/16 0735    Code Status History    Date Active Date Inactive Code Status Order ID Comments User Context   12/27/2014  4:45 AM 12/29/2014  3:58 PM Full Code [id number]  Oralia Manis, MD ED    Advance Directive Documentation     Most Recent Value  Type of Advance Directive  Healthcare Power of Attorney, Living will  Pre-existing out of facility DNR order (yellow form or pink MOST form)  -  "MOST" Form in  Place?  -      TOTAL TIME TAKING CARE OF THIS PATIENT: 35 minutes.    Katharina Caper M.D on 10/20/2016 at 2:01  PM  Between 7am to 6pm - Pager - [phone]  After 6pm go to [email or web address] - password [email or web address] [facility]  [place] [facility]  Office  [phone]  CC: Primary care physician; Leim Fabry, MD

## 2016-10-20 NOTE — H&P (Signed)
Tammy Obrien is an 76 y.o. female.   Chief Complaint: Altered mental status HPI: The patient with past medical history of hypothyroidism, hypertension, CHF, and CAD presents to the emergency department altered mental status. The patient's children thought she was not acting like herself which prompted them to seek evaluation in the emergency department. The patient states that she feels fine and has no complaints. However, she has word finding difficulty at times and is slow to answer simple questions about her present state. Currently, she is oriented to person, place, situation and time. She denies urinary symptoms, fever, nausea, vomiting, diarrhea, shortness of breath or chest pain. CT of her head was unremarkable. MRI was ordered but pending at the time of evaluation. Due to her concerning presentation the emergency department staff asked the hospitalist service to evaluate further.  Past Medical History:  Diagnosis Date  . 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 CAD (coronary artery disease)   . CHF (congestive heart failure) ([place])   . HLD (hyperlipidemia)   . Hypertension   . RA (rheumatoid arthritis) ([place])   . Thyroid disease   . Type 2 diabetes mellitus ([place])     Past Surgical History:  Procedure Laterality Date  . ABDOMINAL HYSTERECTOMY    . BLEPHAROPLASTY    . CARDIAC SURGERY    . CATARACT EXTRACTION      Family History  Problem Relation Age of Onset  . Hypertension Mother   . Cancer Mother   . Glaucoma Mother   . Cancer Father   . Heart attack Sister   . Diabetes Brother   . Stroke Brother   . Glaucoma Brother    Social History:  reports that she has never smoked. She has never used smokeless tobacco. She reports that she does not drink alcohol or use drugs.  Allergies:  Allergies  Allergen Reactions  . Zocor [Simvastatin] Itching and Other (See Comments)    Other Reaction: muscle aches, weakness  . Enalaprilat Other (See Comments)  . Flexeril [Cyclobenzaprine]  Itching and Other (See Comments)    Other Reaction: URTICARIA  . Verapamil Itching    Medications Prior to Admission  Medication Sig Dispense Refill  . aspirin EC 81 MG tablet Take 1 tablet by mouth daily.    Marland Kitchen atorvastatin (LIPITOR) 10 MG tablet Take 1 tablet by mouth daily.    . carvedilol (COREG) 3.125 MG tablet Take 2 tablets by mouth 2 (two) times daily.     . Cholecalciferol (VITAMIN D3) 5000 UNITS TABS Take 1 tablet by mouth daily.    . fluconazole (DIFLUCAN) 150 MG tablet Take 1 tablet (150 mg total) by mouth daily. Take one pill orally once as needed. 1 tablet 0  . folic acid (FOLVITE) 1 MG tablet Take 1 tablet by mouth daily.    . methotrexate (RHEUMATREX) 2.5 MG tablet Take 1 tablet by mouth daily.    . Multiple Vitamins-Minerals (MULTIVITAMIN ADULT PO) Take 1 tablet by mouth daily.    . nitrofurantoin, macrocrystal-monohydrate, (MACROBID) 100 MG capsule Take 1 capsule (100 mg total) by mouth 2 (two) times daily. 10 capsule 0  . NONFORMULARY OR COMPOUNDED [place] compound:  Onychomycosis Nail Lacquer - Fluconazole 2%, Terbinafine 1%, DMSO, apply to the affected area daily.  +11Refills. 120 each 11  . phenazopyridine (PYRIDIUM) 200 MG tablet Take 1 tablet (200 mg total) by mouth 3 (three) times daily as needed for pain. 15 tablet 0  . potassium chloride SA (K-DUR,KLOR-CON) 20 MEQ tablet Take  2 tablets by mouth daily.    . predniSONE (DELTASONE) 1 MG tablet Take 2 tablets by mouth daily.    Marland Kitchen terbinafine (LAMISIL) 250 MG tablet Take 1 tablet (250 mg total) by mouth daily. 90 tablet 0  . traMADol (ULTRAM) 50 MG tablet Take 1 tablet by mouth 2 (two) times daily.    Marland Kitchen triamcinolone cream (KENALOG) 0.1 %     . triamterene-hydrochlorothiazide (DYAZIDE) 37.5-25 MG capsule Take 1 capsule by mouth daily.    Marland Kitchen ZETIA 10 MG tablet Take 1 tablet by mouth daily.      Results for orders placed or performed during the hospital encounter of 10/20/16 (from the past 48 hour(s))  CBC      Status: Abnormal   Collection Time: 10/20/16  2:35 AM  Result Value Ref Range   WBC 5.1 3.6 - 11.0 K/uL   RBC 5.25 (H) 3.80 - 5.20 MIL/uL   Hemoglobin 14.6 12.0 - 16.0 g/dL   HCT 44.7 35.0 - 47.0 %   MCV 85.0 80.0 - 100.0 fL   MCH 27.8 26.0 - 34.0 pg   MCHC 32.7 32.0 - 36.0 g/dL   RDW 14.2 11.5 - 14.5 %   Platelets 184 150 - 440 K/uL  Comprehensive metabolic panel     Status: Abnormal   Collection Time: 10/20/16  2:35 AM  Result Value Ref Range   Sodium 131 (L) 135 - 145 mmol/L   Potassium 4.0 3.5 - 5.1 mmol/L    Comment: HEMOLYSIS AT THIS LEVEL MAY AFFECT RESULT   Chloride 96 (L) 101 - 111 mmol/L   CO2 24 22 - 32 mmol/L   Glucose, Bld 125 (H) 65 - 99 mg/dL   BUN 17 6 - 20 mg/dL   Creatinine, Ser 1.18 (H) 0.44 - 1.00 mg/dL   Calcium 9.0 8.9 - 10.3 mg/dL   Total Protein 7.9 6.5 - 8.1 g/dL   Albumin 3.8 3.5 - 5.0 g/dL   AST 35 15 - 41 U/L    Comment: HEMOLYSIS AT THIS LEVEL MAY AFFECT RESULT   ALT 24 14 - 54 U/L   Alkaline Phosphatase 85 38 - 126 U/L   Total Bilirubin 1.2 0.3 - 1.2 mg/dL    Comment: HEMOLYSIS AT THIS LEVEL MAY AFFECT RESULT   GFR calc non Af Amer 43 (L) >60 mL/min   GFR calc Af Amer 50 (L) >60 mL/min    Comment: (NOTE) The eGFR has been calculated using the CKD EPI equation. This calculation has not been validated in all clinical situations. eGFR's persistently <60 mL/min signify possible Chronic Kidney Disease.    Anion gap 11 5 - 15  Troponin I     Status: None   Collection Time: 10/20/16  2:35 AM  Result Value Ref Range   Troponin I <0.03 <0.03 ng/mL  Urinalysis, Complete w Microscopic     Status: Abnormal   Collection Time: 10/20/16  4:19 AM  Result Value Ref Range   Color, Urine YELLOW (A) YELLOW   APPearance CLEAR (A) CLEAR   Specific Gravity, Urine 1.016 1.005 - 1.030   pH 6.0 5.0 - 8.0   Glucose, UA NEGATIVE NEGATIVE mg/dL   Hgb urine dipstick SMALL (A) NEGATIVE   Bilirubin Urine NEGATIVE NEGATIVE   Ketones, ur 5 (A) NEGATIVE mg/dL    Protein, ur NEGATIVE NEGATIVE mg/dL   Nitrite NEGATIVE NEGATIVE   Leukocytes, UA SMALL (A) NEGATIVE   RBC / HPF 0-5 0 - 5 RBC/hpf   WBC, UA 6-30  0 - 5 WBC/hpf   Bacteria, UA NONE SEEN NONE SEEN   Squamous Epithelial / LPF 0-5 (A) NONE SEEN   Mucus PRESENT    Hyaline Casts, UA PRESENT    Ct Head Wo Contrast  Result Date: 10/20/2016 CLINICAL DATA:  Altered mental status.  Frontal headache. EXAM: CT HEAD WITHOUT CONTRAST TECHNIQUE: Contiguous axial images were obtained from the base of the skull through the vertex without intravenous contrast. COMPARISON:  Head CT and brain MRI 12/27/2014 FINDINGS: Brain: Stable atrophy and chronic small vessel ischemia No intracranial hemorrhage, mass effect, or midline shift. No hydrocephalus. The basilar cisterns are patent. No evidence of territorial infarct or acute ischemia. No extra-axial or intracranial fluid collection. Vascular: No hyperdense vessel. Skull: No skull fracture or focal lesion. Sinuses/Orbits: Paranasal sinuses and mastoid air cells are clear. The visualized orbits are unremarkable. Other: None. IMPRESSION: No acute intracranial abnormality. Electronically Signed   By: Jeb Levering M.D.   On: 10/20/2016 03:08   Mr Brain W And Wo Contrast  Result Date: 10/20/2016 CLINICAL DATA:  Altered level of consciousness. Frontal headache. Confusion. EXAM: MRI HEAD WITHOUT AND WITH CONTRAST TECHNIQUE: Multiplanar, multiecho pulse sequences of the brain and surrounding structures were obtained without and with intravenous contrast. CONTRAST:  1m MULTIHANCE GADOBENATE DIMEGLUMINE 529 MG/ML IV SOLN COMPARISON:  Head CT from earlier today.  Brain MRI 12/27/2014 FINDINGS: Brain: No acute infarction, hemorrhage, hydrocephalus, extra-axial collection or mass lesion. There is generalized cerebral volume loss and chronic microvascular ischemic change in the cerebral white matter, mild for age. No abnormal intracranial enhancement. Vascular: Major vessels are  patent. Skull and upper cervical spine: Negative for marrow lesion Sinuses/Orbits: Bilateral cataract resection. Nonspecific divergent gaze. Essentially negative paranasal sinuses. IMPRESSION: Senescent changes without new or acute finding. Electronically Signed   By: JMonte FantasiaM.D.   On: 10/20/2016 07:36    Review of Systems  Constitutional: Negative for chills and fever.  HENT: Negative for sore throat and tinnitus.   Eyes: Negative for blurred vision and redness.  Respiratory: Negative for cough and shortness of breath.   Cardiovascular: Negative for chest pain, palpitations, orthopnea and PND.  Gastrointestinal: Negative for abdominal pain, diarrhea, nausea and vomiting.  Genitourinary: Negative for dysuria, frequency and urgency.  Musculoskeletal: Negative for joint pain and myalgias.  Skin: Negative for rash.       No lesions  Neurological: Negative for speech change, focal weakness and weakness.  Endo/Heme/Allergies: Does not bruise/bleed easily.       No temperature intolerance  Psychiatric/Behavioral: Negative for depression and suicidal ideas.    Blood pressure (!) 150/69, pulse 70, temperature 98.7 F (37.1 C), resp. rate 17, height 5' 6.5" (1.689 m), weight 80.7 kg (178 lb), SpO2 95 %. Physical Exam  Vitals reviewed. Constitutional: She is oriented to person, place, and time. She appears well-developed and well-nourished. No distress.  HENT:  Head: Normocephalic and atraumatic.  Mouth/Throat: Oropharynx is clear and moist.  Eyes: Pupils are equal, round, and reactive to light. Conjunctivae and EOM are normal.  Neck: Normal range of motion. Neck supple. No JVD present. No tracheal deviation present. No thyromegaly present.  Cardiovascular: Normal rate, regular rhythm and normal heart sounds.  Exam reveals no gallop and no friction rub.   No murmur heard. Respiratory: Effort normal and breath sounds normal.  GI: Soft. Bowel sounds are normal. She exhibits no  distension. There is no tenderness.  Genitourinary:  Genitourinary Comments: Deferred  Musculoskeletal: Normal range of motion. She exhibits  no edema.  Lymphadenopathy:    She has no cervical adenopathy.  Neurological: She is alert and oriented to person, place, and time. No cranial nerve deficit. She exhibits normal muscle tone.  Skin: Skin is warm and dry. No rash noted. No erythema.  Psychiatric: She has a normal mood and affect. Her behavior is normal. Judgment and thought content normal.     Assessment/Plan This is a 76 year old female admitted for confusion. 1. Confusion: Difficult to characterize but it is clear that the patient does not understand the entire rationale for hospitalization or why her family was concerned. MRI pending. Differential diagnosis includes onset of dementia. 2. Pyuria: No indication of infection although I have started the patient on fluconazole for treatment of presumptive vaginal candidiasis. Continue pyritic and spasms 3. CAD: Stable; continue aspirin 4. Diabetes mellitus type 2: Check hemoglobin A1c 5. Hypertension: Controlled for age; continue carvedilol and Dyazide 6. Hyperlipidemia: Continue statin therapy and ezetimibe 7. Rheumatoid arthritis: Stable; continue methotrexate and prednisone 8. DVT prophylaxis: Lovenox 9. GI prophylaxis: None The patient is a full code. Time spent on admission orders and patient care possibly 45 minutes  Harrie Foreman, MD 10/20/2016, 7:50 AM

## 2016-10-20 NOTE — ED Triage Notes (Addendum)
Pt arrives to ED via EMS from home with c/o AMS and frontal headache. Per EMS, pt's family reports she hasn't been herself today and requested that she be evaluated in the ED. EMS states that pt had 1 episode of N/V today; pt reports frontal headache that is 10/10 pain. Family reported to EMS that the pt has not be able to answer questions as quickly as she normally would about things she should know. Pt is A&O, in NAD; RR even, regular, and unlabored. Dr Manson Passey at bedside upon pt's arrival to ED.

## 2016-10-20 NOTE — ED Notes (Signed)
Patient transported to MRI at this time. 

## 2016-10-20 NOTE — ED Notes (Signed)
Dr Manson Passey at bedside to update pt on results and plan of care.

## 2016-10-20 NOTE — ED Provider Notes (Signed)
[facility] Emergency Department Provider Note    First MD Initiated Contact with Patient 10/20/16 0222     (approximate)  I have reviewed the triage vital signs and the nursing notes.   HISTORY  Chief Complaint Altered Mental Status   HPI Tammy Obrien is a 76 y.o. female presents to the emergency department from home via EMS with altered mental status and frontal headache. Per EMS patient's family reports that she has "not been herself today". Patient also admits to feeling confused. Patient states that she has a frontal headache that is currently 10 out of 10 which was associated with 1 episode of vomiting today. Per family patient has been unable to answer questions as quickly as she normally would and clearly has a prolonged delay.atient denies any weakness numbness gait instability or visual changes.   Past Medical History:  Diagnosis Date  . Arterial stent thrombosis ([facility])   . CAD (coronary artery disease)   . CHF (congestive heart failure) ([facility])   . HLD (hyperlipidemia)   . Hypertension   . RA (rheumatoid arthritis) ([facility])   . Thyroid disease   . Type 2 diabetes mellitus [facility])     Patient Active Problem List   Diagnosis Date Noted  . Blepharitis 08/04/2015  . Arteriosclerosis of coronary artery 08/04/2015  . Epiphora 08/04/2015  . Episcleritis 08/04/2015  . 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 Glaucoma suspect 08/04/2015  . Hypercholesterolemia 08/04/2015  . Obstructive apnea 08/04/2015  . 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 Acute encephalopathy 12/27/2014  . Type 2 diabetes mellitus ([facility]) 12/27/2014  . HTN (hypertension) 12/27/2014  . HLD (hyperlipidemia) 12/27/2014  . CAD (coronary artery disease) 12/27/2014  . RA (rheumatoid arthritis) ([facility]) 12/27/2014  . Arthritis of knee, degenerative 03/05/2013  . Blood glucose elevated 11/26/2012  . Essential (primary) hypertension  09/05/2011  . Adiposity 09/05/2011  . Arthritis, degenerative 09/05/2011  . 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 Apnea, sleep 09/05/2011  . H/O high risk medication treatment 06/01/2011  . Degenerative arthritis of hip 12/15/2010  . Rheumatoid arthritis involving multiple joints ([facility]) 10/22/2010    Past Surgical History:  Procedure Laterality Date  . ABDOMINAL HYSTERECTOMY    . BLEPHAROPLASTY    . CARDIAC SURGERY    . CATARACT EXTRACTION      Prior to Admission medications   Medication Sig Start Date End Date Taking? Authorizing Provider  aspirin EC 81 MG tablet Take 1 tablet by mouth daily. 10/09/13   [provider]  atorvastatin (LIPITOR) 10 MG tablet Take 1 tablet by mouth daily. 10/06/14   [provider]  carvedilol (COREG) 3.125 MG tablet Take 2 tablets by mouth 2 (two) times daily.  11/05/14   [provider]  Cholecalciferol (VITAMIN D3) 5000 UNITS TABS Take 1 tablet by mouth daily.    [provider]  fluconazole (DIFLUCAN) 150 MG tablet Take 1 tablet (150 mg total) by mouth daily. Take one pill orally once as needed. 08/19/16   Renford Dills, NP  folic acid (FOLVITE) 1 MG tablet Take 1 tablet by mouth daily. 10/06/14   [provider]  methotrexate (RHEUMATREX) 2.5 MG tablet Take 1 tablet by mouth daily. 12/05/14   [provider]  Multiple Vitamins-Minerals (MULTIVITAMIN ADULT PO) Take 1 tablet by mouth daily. 04/02/07   [provider]  nitrofurantoin, macrocrystal-monohydrate, (MACROBID) 100 MG capsule Take 1 capsule (100 mg total) by mouth 2 (  two) times daily. 08/19/16   Renford Dills, NP  NONFORMULARY OR COMPOUNDED ITEM [facility] compound:  Onychomycosis Nail Lacquer - Fluconazole 2%, Terbinafine 1%, DMSO, apply to the affected area daily.  +11Refills. 03/05/15   Asencion Islam, DPM  phenazopyridine (PYRIDIUM) 200 MG tablet Take 1 tablet (200 mg total) by mouth 3 (three) times daily  as needed for pain. 05/19/16   Sudie Grumbling, NP  phenazopyridine (PYRIDIUM) 200 MG tablet Take 1 tablet (200 mg total) by mouth 3 (three) times daily as needed for pain. 06/01/16   Hassan Rowan, MD  potassium chloride SA (K-DUR,KLOR-CON) 20 MEQ tablet Take 2 tablets by mouth daily. 12/22/14   [provider]  predniSONE (DELTASONE) 1 MG tablet Take 2 tablets by mouth daily. 11/21/14   [provider]  terbinafine (LAMISIL) 250 MG tablet Take 1 tablet (250 mg total) by mouth daily. 05/31/16   Felecia Shelling, DPM  traMADol (ULTRAM) 50 MG tablet Take 1 tablet by mouth 2 (two) times daily. 10/06/14   [provider]  triamcinolone cream (KENALOG) 0.1 %  11/18/14   [provider]  triamterene-hydrochlorothiazide (DYAZIDE) 37.5-25 MG capsule Take 1 capsule by mouth daily. 11/05/14   [provider]  ZETIA 10 MG tablet Take 1 tablet by mouth daily. 09/18/14   [provider]    Allergies Zocor [simvastatin]; Enalaprilat; Flexeril [cyclobenzaprine]; and Verapamil  Family History  Problem Relation Age of Onset  . Hypertension Mother   . Cancer Mother   . Glaucoma Mother   . Cancer Father   . Heart attack Sister   . Diabetes Brother   . Stroke Brother   . Glaucoma Brother     Social History Social History  Substance Use Topics  . Smoking status: Never Smoker  . Smokeless tobacco: Never Used  . Alcohol use No    Review of Systems Constitutional: No fever/chills Eyes: No visual changes. ENT: No sore throat. Cardiovascular: Denies chest pain. Respiratory: Denies shortness of breath. Gastrointestinal: No abdominal pain.  No nausea, no vomiting.  No diarrhea.  No constipation. Genitourinary: Negative for dysuria. Musculoskeletal: Negative for neck pain.  Negative for back pain. Integumentary: Negative for rash. Neurological: positive for headache and confusion  ____________________________________________   PHYSICAL EXAM:  VITAL  SIGNS: ED Triage Vitals  Enc Vitals Group     BP 10/20/16 0231 (!) 168/108     Pulse Rate 10/20/16 0231 74     Resp 10/20/16 0231 18     Temp 10/20/16 0231 98.8 F (37.1 C)     Temp Source 10/20/16 0231 Oral     SpO2 10/20/16 0231 99 %     Weight 10/20/16 0232 80.7 kg (178 lb)     Height 10/20/16 0232 1.689 m (5' 6.5")     Head Circumference --      Peak Flow --      Pain Score 10/20/16 0231 10     Pain Loc --      Pain Edu? --      Excl. in GC? --     Constitutional: Alert and oriented. Well appearing and in no acute distress. Eyes: Conjunctivae are normal. PERRL. EOMI. Head: Atraumatic. Mouth/Throat: Mucous membranes are moist.  Oropharynx non-erythematous. Neck: No stridor.  No meningeal signs.   Cardiovascular: Normal rate, regular rhythm. Good peripheral circulation. Grossly normal heart sounds. Respiratory: Normal respiratory effort.  No retractions. Lungs CTAB. Gastrointestinal: Soft and nontender. No distention.   Musculoskeletal: No lower extremity tenderness nor  edema. No gross deformities of extremities. Neurologic:  Normal speech and language. No gross focal neurologic deficits are appreciated.  Skin:  Skin is warm, dry and intact. No rash noted. Psychiatric: Mood and affect are normal. Speech and behavior are normal.  ____________________________________________   LABS (all labs ordered are listed, but only abnormal results are displayed)  Labs Reviewed  CBC - Abnormal; Notable for the following:       Result Value   RBC 5.25 (*)    All other components within normal limits  COMPREHENSIVE METABOLIC PANEL - Abnormal; Notable for the following:    Sodium 131 (*)    Chloride 96 (*)    Glucose, Bld 125 (*)    Creatinine, Ser 1.18 (*)    GFR calc non Af Amer 43 (*)    GFR calc Af Amer 50 (*)    All other components within normal limits  URINALYSIS, COMPLETE (UACMP) WITH MICROSCOPIC - Abnormal; Notable for the following:    Color, Urine YELLOW (*)     APPearance CLEAR (*)    Hgb urine dipstick SMALL (*)    Ketones, ur 5 (*)    Leukocytes, UA SMALL (*)    Squamous Epithelial / LPF 0-5 (*)    All other components within normal limits  URINE CULTURE  TROPONIN I   ____________________________________________  EKG ED ECG REPORT I, [place] N Ilia Engelbert, the attending physician, personally viewed and interpreted this ECG.   Date: 10/20/2016  EKG Time: 2:36 AM  Rate: 74  Rhythm: normal sinus rhythm  Axis: normal  Intervals:normal  ST&T Change: none  ____________________________________________  RADIOLOGY I, Helen N Lenford Beddow, personally viewed and evaluated these images (plain radiographs) as part of my medical decision making, as well as reviewing the written report by the radiologist.  Ct Head Wo Contrast  Result Date: 10/20/2016 CLINICAL DATA:  Altered mental status.  Frontal headache. EXAM: CT HEAD WITHOUT CONTRAST TECHNIQUE: Contiguous axial images were obtained from the base of the skull through the vertex without intravenous contrast. COMPARISON:  Head CT and brain MRI 12/27/2014 FINDINGS: Brain: Stable atrophy and chronic small vessel ischemia No intracranial hemorrhage, mass effect, or midline shift. No hydrocephalus. The basilar cisterns are patent. No evidence of territorial infarct or acute ischemia. No extra-axial or intracranial fluid collection. Vascular: No hyperdense vessel. Skull: No skull fracture or focal lesion. Sinuses/Orbits: Paranasal sinuses and mastoid air cells are clear. The visualized orbits are unremarkable. Other: None. IMPRESSION: No acute intracranial abnormality. Electronically Signed   By: Rubye Oaks M.D.   On: 10/20/2016 03:08      Procedures   ____________________________________________   INITIAL IMPRESSION / ASSESSMENT AND PLAN / ED COURSE  Pertinent labs & imaging results that were available during my care of the patient were reviewed by me and considered in my medical decision making  (see chart for details).  76 year old female presenting to the emergency department with altered mental status of unclear etiology at this point. CT scan of the head revealed no acute intracranial abnormality.      ____________________________________________  FINAL CLINICAL IMPRESSION(S) / ED DIAGNOSES  Final diagnoses:  Altered mental status, unspecified altered mental status type     MEDICATIONS GIVEN DURING THIS VISIT:  Medications - No data to display   NEW OUTPATIENT MEDICATIONS STARTED DURING THIS VISIT:  New Prescriptions   No medications on file    Modified Medications   No medications on file    Discontinued Medications   No medications on file  Note:  This document was prepared using Dragon voice recognition software and may include unintentional dictation errors.    Darci Current, MD 10/20/16 [phone]

## 2016-10-21 DIAGNOSIS — R8281 Pyuria: Secondary | ICD-10-CM

## 2016-10-21 DIAGNOSIS — R195 Other fecal abnormalities: Secondary | ICD-10-CM

## 2016-10-21 DIAGNOSIS — N189 Chronic kidney disease, unspecified: Secondary | ICD-10-CM

## 2016-10-21 DIAGNOSIS — E871 Hypo-osmolality and hyponatremia: Secondary | ICD-10-CM

## 2016-10-21 DIAGNOSIS — E860 Dehydration: Secondary | ICD-10-CM | POA: Diagnosis not present

## 2016-10-21 DIAGNOSIS — N289 Disorder of kidney and ureter, unspecified: Secondary | ICD-10-CM

## 2016-10-21 DIAGNOSIS — R739 Hyperglycemia, unspecified: Secondary | ICD-10-CM

## 2016-10-21 DIAGNOSIS — N182 Chronic kidney disease, stage 2 (mild): Secondary | ICD-10-CM

## 2016-10-21 LAB — BASIC METABOLIC PANEL
Anion gap: 7 (ref 5–15)
BUN: 14 mg/dL (ref 6–20)
CALCIUM: 8.5 mg/dL — AB (ref 8.9–10.3)
CO2: 25 mmol/L (ref 22–32)
CREATININE: 0.95 mg/dL (ref 0.44–1.00)
Chloride: 106 mmol/L (ref 101–111)
GFR calc Af Amer: >60 mL/min (ref >60)
GFR, EST NON AFRICAN AMERICAN: 56 mL/min — AB (ref >60)
GLUCOSE: 100 mg/dL — AB (ref 65–99)
Potassium: 3.7 mmol/L (ref 3.5–5.1)
Sodium: 138 mmol/L (ref 135–145)

## 2016-10-21 LAB — URINE CULTURE: Culture: NO GROWTH

## 2016-10-21 MED ORDER — CEPHALEXIN 250 MG PO CAPS: 250.0000 mg | ORAL_CAPSULE | Freq: Two times a day (BID) | ORAL | 0 refills | Status: DC

## 2016-10-21 MED ORDER — CEPHALEXIN 250 MG PO CAPS: 250.0000 mg | ORAL_CAPSULE | Freq: Two times a day (BID) | ORAL | 0 refills | Status: AC

## 2016-10-21 NOTE — Care Management Obs Status (Signed)
MEDICARE OBSERVATION STATUS NOTIFICATION   Patient Details  Name: Tammy Obrien MRN: [id number] Date of Birth: [birth year]   Medicare Observation Status Notification Given:  Yes    Gwenette Greet, RN 10/21/2016, 9:56 AM

## 2016-10-21 NOTE — Evaluation (Signed)
Physical Therapy Evaluation Patient Details Name: Tammy Obrien MRN: [id number] DOB: 09-14-1940 Today's Date: 10/21/2016   History of Present Illness  76 y/o with past medical history of hypothyroidism, hypertension, CHF, and CAD presents to the emergency department altered mental status. The patient's children thought she was not acting like herself which prompted them to seek evaluation.  MRI was negative, pt feeling back to baseline.     Clinical Impression  Pt is able to ambulate well, showed good confidence, speed, and had very little fatigue.  She had equal strength b/l, no sensation issues and ultimately is at/near her baseline and does not require further PT intervention.  Pt safe to return home.     Follow Up Recommendations No PT follow up    Equipment Recommendations       Recommendations for Other Services       Precautions / Restrictions Precautions Precautions: Fall Restrictions Weight Bearing Restrictions: No      Mobility  Bed Mobility Overal bed mobility: Independent                Transfers Overall transfer level: Independent Equipment used: None                Ambulation/Gait Ambulation/Gait assistance: Independent Ambulation Distance (Feet): 300 Feet Assistive device: None       General Gait Details: Pt is able to ambulate without hesitation, LOB or safety issues. She had consistent cadence and speed and is at/near her baseline.    Stairs Stairs: Yes Stairs assistance: Independent Stair Management: One rail Right Number of Stairs: 6 General stair comments: Pt was able to negotiate steps w/o issue  Wheelchair Mobility    Modified Rankin (Stroke Patients Only)       Balance Overall balance assessment: Independent                                           Pertinent Vitals/Pain Pain Assessment: No/denies pain    Home Living Family/patient expects to be discharged to:: Private residence Living  Arrangements: Alone   Type of Home: House Home Access: Stairs to enter Entrance Stairs-Rails: Right Entrance Stairs-Number of Steps: 5          Prior Function Level of Independence: Independent         Comments: Pt still working, able to be very active     Higher education careers adviser        Extremity/Trunk Assessment   Upper Extremity Assessment Upper Extremity Assessment: Overall WFL for tasks assessed    Lower Extremity Assessment Lower Extremity Assessment: Overall WFL for tasks assessed       Communication   Communication: No difficulties  Cognition Arousal/Alertness: Awake/alert Behavior During Therapy: WFL for tasks assessed/performed Overall Cognitive Status: Within Functional Limits for tasks assessed                                        General Comments      Exercises     Assessment/Plan    PT Assessment Patent does not need any further PT services  PT Problem List         PT Treatment Interventions      PT Goals (Current goals can be found in the Care Plan section)  Acute Rehab PT Goals Patient Stated Goal:  go home PT Goal Formulation: All assessment and education complete, DC therapy    Frequency     Barriers to discharge        Co-evaluation               AM-PAC PT "6 Clicks" Daily Activity  Outcome Measure Difficulty turning over in bed (including adjusting bedclothes, sheets and blankets)?: None Difficulty moving from lying on back to sitting on the side of the bed? : None Difficulty sitting down on and standing up from a chair with arms (e.g., wheelchair, bedside commode, etc,.)?: None Help needed moving to and from a bed to chair (including a wheelchair)?: None Help needed walking in hospital room?: None Help needed climbing 3-5 steps with a railing? : None 6 Click Score: 24    End of Session Equipment Utilized During Treatment: Gait belt Activity Tolerance: Patient tolerated treatment well   Nurse  Communication: Mobility status PT Visit Diagnosis: Muscle weakness (generalized) (M62.81)    Time: 6063-0160 PT Time Calculation (min) (ACUTE ONLY): 17 min   Charges:   PT Evaluation $PT Eval Low Complexity: 1 Low     PT G Codes:   PT G-Codes **NOT FOR INPATIENT CLASS** Functional Assessment Tool Used: AM-PAC 6 Clicks Basic Mobility Functional Limitation: Mobility: Walking and moving around Mobility: Walking and Moving Around Current Status (F0932): 0 percent impaired, limited or restricted Mobility: Walking and Moving Around Goal Status (T5573): 0 percent impaired, limited or restricted Mobility: Walking and Moving Around Discharge Status (U2025): 0 percent impaired, limited or restricted    Malachi Pro, DPT 10/21/2016, 11:00 AM

## 2016-10-21 NOTE — Progress Notes (Signed)
Discharge paperwork reviewed with patient who verbalized understanding. New paper prescription for Keflex given. Pt understands follow up apts, new medications and medication changes. Patient is stable and ready for discharge. Patient's daughter to transport home.

## 2016-10-21 NOTE — Discharge Summary (Signed)
[facility] - [place] at [facility]   PATIENT NAME: Tammy Obrien    MR#:  [id number]  DATE OF BIRTH:  11/05/1940  DATE OF ADMISSION:  10/20/2016 ADMITTING PHYSICIAN: Arnaldo Natal, MD  DATE OF DISCHARGE: 10/21/2016 11:04 AM  PRIMARY CARE PHYSICIAN: Leim Fabry, MD     ADMISSION DIAGNOSIS:  Altered mental status, unspecified altered mental status type [R41.82]  DISCHARGE DIAGNOSIS:  Active Problems:   Confusion   Dehydration   Hyponatremia   Acute on chronic renal insufficiency   CKD (chronic kidney disease), stage II   Hyperglycemia   Pyuria   Loose stools   SECONDARY DIAGNOSIS:   Past Medical History:  Diagnosis Date  . Arterial stent thrombosis (HCC)   . CAD (coronary artery disease)   . CHF (congestive heart failure) (HCC)   . HLD (hyperlipidemia)   . Hypertension   . RA (rheumatoid arthritis) (HCC)   . Thyroid disease   . Type 2 diabetes mellitus (HCC)     .pro HOSPITAL COURSE:   The patient is 76 year old Ativan again female with past medical history significant for history of hypothyroidism, hypertension, CHF, coronary artery disease, who presented to the hospital with complaints of altered mental status, she had difficulty finding words. She was admitted to the hospital for evaluation and treatment.  She was noted to have elevated white blood cell count on urinalysis, felt could be  urinary tract infection, initiated on  Keflex . Her labs on arrival to the hospital also revealed hyponatremia, which improved on IV fluid administration, it was felt to be HCTZ, dehydration, free fluid intake related. Patient's condition improved clinically and she was stable to be discharged home. Discussion by problem: #1. Confusion, suspected dehydration, possibly urinary tract infection, hyponatremia related, clinically improved with conservative therapy, brain MRI was unremarkable, no acute changes, patient was seen by physical  therapist and no physical therapy follow-up was recommended at home.  #2. Hyponatremia,  resolved on IV fluids, especially dehydration, HCTZ related, now off HCTZ #3. Acute renal insufficiency, resolved on IV fluid administration, urinalysis was concerning for possible infection, continue Keflex for 2 more days,  follow urinary cultures as outpatient #4. Hyperglycemia, hemoglobin A1c was 6.0, prediabetes, diabetic diet was recommended #5. Urinary tract infection, cultures are taken, continue Keflex for 2 more days #6 episode of loose stool, resolved #7. Generalized weakness, patient was evaluated by physical therapist and recommended no physical therapy follow-up DISCHARGE CONDITIONS:   stable  CONSULTS OBTAINED:    DRUG ALLERGIES:   Allergies  Allergen Reactions  . Zocor [Simvastatin] Itching and Other (See Comments)    Other Reaction: muscle aches, weakness  . Enalaprilat Other (See Comments)  . Flexeril [Cyclobenzaprine] Itching and Other (See Comments)    Other Reaction: URTICARIA  . Verapamil Itching    DISCHARGE MEDICATIONS:   Discharge Medication List as of 10/21/2016  9:20 AM    START taking these medications   Details  cephALEXin (KEFLEX) 250 MG capsule Take 1 capsule (250 mg total) by mouth 2 (two) times daily., Starting Fri 10/21/2016, Until Mon 10/31/2016, Normal      CONTINUE these medications which have NOT CHANGED   Details  aspirin EC 81 MG tablet Take 1 tablet by mouth daily., Starting 10/09/2013, Until Discontinued, Historical Med    atorvastatin (LIPITOR) 10 MG tablet Take 1 tablet by mouth daily., Starting 10/06/2014, Until Discontinued, Historical Med    carvedilol (COREG) 6.25 MG tablet Take 6.25 mg by mouth 2 (  two) times daily. , Starting Wed 11/05/2014, Historical Med    Cholecalciferol (VITAMIN D3) 5000 UNITS TABS Take 1 tablet by mouth daily., Until Discontinued, Historical Med    folic acid (FOLVITE) 1 MG tablet Take 1 tablet by mouth daily., Starting  10/06/2014, Until Discontinued, Historical Med    hydrOXYzine (ATARAX/VISTARIL) 25 MG tablet Take 25 mg by mouth at bedtime., Starting Tue 08/30/2016, Historical Med    methotrexate (RHEUMATREX) 2.5 MG tablet Take 17.5 mg by mouth once a week. , Starting Fri 12/05/2014, Historical Med    Multiple Vitamins-Minerals (MULTIVITAMIN ADULT PO) Take 1 tablet by mouth daily., Starting 04/02/2007, Until Discontinued, Historical Med    NONFORMULARY OR COMPOUNDED ITEM [facility] compound:  Onychomycosis Nail Lacquer - Fluconazole 2%, Terbinafine 1%, DMSO, apply to the affected area daily.  +11Refills., Print    oxybutynin (DITROPAN-XL) 5 MG 24 hr tablet Take 5 mg by mouth daily., Starting Mon 10/10/2016, Historical Med    potassium chloride SA (K-DUR,KLOR-CON) 20 MEQ tablet Take 40 mEq by mouth daily. , Starting Mon 12/22/2014, Historical Med    triamcinolone cream (KENALOG) 0.1 % Apply 1 application topically daily as needed. , Starting Tue 11/18/2014, Historical Med    ZETIA 10 MG tablet Take 10 mg by mouth daily. , Starting Thu 09/18/2014, Historical Med      STOP taking these medications     phenazopyridine (PYRIDIUM) 200 MG tablet      terbinafine (LAMISIL) 250 MG tablet      triamterene-hydrochlorothiazide (DYAZIDE) 37.5-25 MG capsule          DISCHARGE INSTRUCTIONS:    The patient is to follow-up with primary care physician within one week after discharge  If you experience worsening of your admission symptoms, develop shortness of breath, life threatening emergency, suicidal or homicidal thoughts you must seek medical attention immediately by calling 911 or calling your MD immediately  if symptoms less severe.  You Must read complete instructions/literature along with all the possible adverse reactions/side effects for all the Medicines you take and that have been prescribed to you. Take any new Medicines after you have completely understood and accept all the possible adverse  reactions/side effects.   Please note  You were cared for by a hospitalist during your hospital stay. If you have any questions about your discharge medications or the care you received while you were in the hospital after you are discharged, you can call the unit and asked to speak with the hospitalist on call if the hospitalist that took care of you is not available. Once you are discharged, your primary care physician will handle any further medical issues. Please note that NO REFILLS for any discharge medications will be authorized once you are discharged, as it is imperative that you return to your primary care physician (or establish a relationship with a primary care physician if you do not have one) for your aftercare needs so that they can reassess your need for medications and monitor your lab values.    Today   CHIEF COMPLAINT:   Chief Complaint  Patient presents with  . Altered Mental Status    HISTORY OF PRESENT ILLNESS:     VITAL SIGNS:  Blood pressure (!) 130/53, pulse 70, temperature 98.9 F (37.2 C), temperature source Oral, resp. rate 14, height 5' 6.5" (1.689 m), weight 83.8 kg (184 lb 12.8 oz), SpO2 94 %.  I/O:   Intake/Output Summary (Last 24 hours) at 10/21/16 1347 Last data filed at 10/21/16 [phone]  Gross per 24 hour  Intake          2378.33 ml  Output                0 ml  Net          2378.33 ml    PHYSICAL EXAMINATION:  GENERAL:  76 y.o.-year-old patient lying in the bed with no acute distress.  EYES: Pupils equal, round, reactive to light and accommodation. No scleral icterus. Extraocular muscles intact.  HEENT: Head atraumatic, normocephalic. Oropharynx and nasopharynx clear.  NECK:  Supple, no jugular venous distention. No thyroid enlargement, no tenderness.  LUNGS: Normal breath sounds bilaterally, no wheezing, rales,rhonchi or crepitation. No use of accessory muscles of respiration.  CARDIOVASCULAR: S1, S2 normal. No murmurs, rubs, or gallops.    ABDOMEN: Soft, non-tender, non-distended. Bowel sounds present. No organomegaly or mass.  EXTREMITIES: No pedal edema, cyanosis, or clubbing.  NEUROLOGIC: Cranial nerves II through XII are intact. Muscle strength 5/5 in all extremities. Sensation intact. Gait not checked.  PSYCHIATRIC: The patient is alert and oriented x 3.  SKIN: No obvious rash, lesion, or ulcer.   DATA REVIEW:   CBC  Recent Labs Lab 10/20/16 0235  WBC 5.1  HGB 14.6  HCT 44.7  PLT 184    Chemistries   Recent Labs Lab 10/20/16 0235 10/21/16 0408  NA 131* 138  K 4.0 3.7  CL 96* 106  CO2 24 25  GLUCOSE 125* 100*  BUN 17 14  CREATININE 1.18* 0.95  CALCIUM 9.0 8.5*  AST 35  --   ALT 24  --   ALKPHOS 85  --   BILITOT 1.2  --     Cardiac Enzymes  Recent Labs Lab 10/20/16 0235  TROPONINI <0.03    Microbiology Results  Results for orders placed or performed during the hospital encounter of 10/20/16  Urine culture     Status: None   Collection Time: 10/20/16  4:19 AM  Result Value Ref Range Status   Specimen Description URINE, RANDOM  Final   Special Requests NONE  Final   Culture   Final    NO GROWTH Performed at [facility], [address], [place], [place] [address]    Report Status 10/21/2016 FINAL  Final    RADIOLOGY:  Ct Head Wo Contrast  Result Date: 10/20/2016 CLINICAL DATA:  Altered mental status.  Frontal headache. EXAM: CT HEAD WITHOUT CONTRAST TECHNIQUE: Contiguous axial images were obtained from the base of the skull through the vertex without intravenous contrast. COMPARISON:  Head CT and brain MRI 12/27/2014 FINDINGS: Brain: Stable atrophy and chronic small vessel ischemia No intracranial hemorrhage, mass effect, or midline shift. No hydrocephalus. The basilar cisterns are patent. No evidence of territorial infarct or acute ischemia. No extra-axial or intracranial fluid collection. Vascular: No hyperdense vessel. Skull: No skull fracture or focal lesion. Sinuses/Orbits:  Paranasal sinuses and mastoid air cells are clear. The visualized orbits are unremarkable. Other: None. IMPRESSION: No acute intracranial abnormality. Electronically Signed   By: Rubye Oaks M.D.   On: 10/20/2016 03:08   Mr Brain W And Wo Contrast  Result Date: 10/20/2016 CLINICAL DATA:  Altered level of consciousness. Frontal headache. Confusion. EXAM: MRI HEAD WITHOUT AND WITH CONTRAST TECHNIQUE: Multiplanar, multiecho pulse sequences of the brain and surrounding structures were obtained without and with intravenous contrast. CONTRAST:  74mL MULTIHANCE GADOBENATE DIMEGLUMINE 529 MG/ML IV SOLN COMPARISON:  Head CT from earlier today.  Brain MRI 12/27/2014 FINDINGS: Brain: No  acute infarction, hemorrhage, hydrocephalus, extra-axial collection or mass lesion. There is generalized cerebral volume loss and chronic microvascular ischemic change in the cerebral white matter, mild for age. No abnormal intracranial enhancement. Vascular: Major vessels are patent. Skull and upper cervical spine: Negative for marrow lesion Sinuses/Orbits: Bilateral cataract resection. Nonspecific divergent gaze. Essentially negative paranasal sinuses. IMPRESSION: Senescent changes without new or acute finding. Electronically Signed   By: Marnee Spring M.D.   On: 10/20/2016 07:36    EKG:   Orders placed or performed during the hospital encounter of 10/20/16  . EKG 12-Lead  . EKG 12-Lead  . EKG 12-Lead  . EKG 12-Lead      Management plans discussed with the patient, family and they are in agreement.  CODE STATUS:     Code Status Orders        Start     Ordered   10/20/16 0736  Full code  Continuous     10/20/16 0735    Code Status History    Date Active Date Inactive Code Status Order ID Comments User Context   12/27/2014  4:45 AM 12/29/2014  3:58 PM Full Code [id number]  Oralia Manis, MD ED    Advance Directive Documentation     Most Recent Value  Type of Advance Directive  Healthcare Power of  Attorney, Living will  Pre-existing out of facility DNR order (yellow form or pink MOST form)  -  "MOST" Form in Place?  -      TOTAL TIME TAKING CARE OF THIS PATIENT: 40 minutes.    Katharina Caper M.D on 10/21/2016 at 1:47 PM  Between 7am to 6pm - Pager - [phone]  After 6pm go to [email or web address] - password [email or web address] [facility]  [facility]  Office  [phone]  CC: Primary care physician; Leim Fabry, MD

## 2016-10-21 NOTE — Care Management Note (Signed)
Case Management Note  Patient Details  Name: Tammy Obrien MRN: [id number] Date of Birth: [birth year]  Subjective/Objective:                  Admitted to [facility] with the diagnosis of confusion. Lives alone. Daughter is Vikki Ports [phone]). Last seen Dr, Greggory Stallion in July 2018. Prescriptions are filled at [facility] in [place] and [employer]. No home Health. No skilled facility, No medical equipment in the home. Takes care of all basic and instrumental activities of daily living herself, drives. Last fall was 2 months ago. Decreased appetite. Lost 5 pounds. Daughter will transport  Action/Plan: No follow-up needs identified at this time   Expected Discharge Date:  10/21/16               Expected Discharge Plan:     In-House Referral:     Discharge planning Services     Post Acute Care Choice:    Choice offered to:     DME Arranged:    DME Agency:     HH Arranged:    HH Agency:     Status of Service:     If discussed at [employer], dates discussed:    Additional Comments:  Gwenette Greet, RNMSN CCM Care Management [phone] 10/21/2016, 9:57 AM

## 2019-12-20 ENCOUNTER — Other Ambulatory Visit: Payer: Self-pay

## 2019-12-20 ENCOUNTER — Ambulatory Visit
Admission: EM | Admit: 2019-12-20 | Discharge: 2019-12-20 | Disposition: A | Discharge status: Home / Self Care | Payer: Medicare Other | Attending: Family Medicine | Admitting: Family Medicine

## 2019-12-20 DIAGNOSIS — N3000 Acute cystitis without hematuria: Secondary | ICD-10-CM | POA: Diagnosis present

## 2019-12-20 LAB — URINALYSIS, COMPLETE (UACMP) WITH MICROSCOPIC
Bilirubin Urine: NEGATIVE
Glucose, UA: NEGATIVE mg/dL
Ketones, ur: NEGATIVE mg/dL
Nitrite: NEGATIVE
Protein, ur: NEGATIVE mg/dL
Specific Gravity, Urine: 1.02 (ref 1.005–1.030)
Squamous Epithelial / LPF: NONE SEEN (ref 0–5)
pH: 5.5 (ref 5.0–8.0)

## 2019-12-20 MED ORDER — CEPHALEXIN 500 MG PO CAPS: 500.0000 mg | ORAL_CAPSULE | Freq: Two times a day (BID) | ORAL | 0 refills | Status: DC

## 2019-12-20 NOTE — ED Triage Notes (Signed)
Patient states that this morning she has been having urinary frequency and urgency.

## 2019-12-20 NOTE — Discharge Instructions (Addendum)
You have a UTI.  Antibiotic twice daily as directed.  Take care  Dr. Adriana Simas

## 2019-12-20 NOTE — ED Provider Notes (Signed)
[facility]    CSN: [id number] Arrival date & time: 12/20/19  2924      History   Chief Complaint Chief Complaint  Patient presents with  . Urinary Frequency   HPI  79 year old female presents with urinary symptoms.  Started this morning.  She reports urinary frequency, urgency, and dysuria.  No abdominal pain.  No flank pain.  No fever.  No hematuria.  Reports a history of UTI.  No medications or interventions tried.  No other complaints.  Past Medical History:  Diagnosis Date  . Arterial stent thrombosis (HCC)   . CAD (coronary artery disease)   . CHF (congestive heart failure) (HCC)   . HLD (hyperlipidemia)   . Hypertension   . RA (rheumatoid arthritis) (HCC)   . Thyroid disease   . Type 2 diabetes mellitus [facility])     Patient Active Problem List   Diagnosis Date Noted  . Dehydration 10/21/2016  . Hyponatremia 10/21/2016  . Acute on chronic renal insufficiency 10/21/2016  . CKD (chronic kidney disease), stage II 10/21/2016  . Hyperglycemia 10/21/2016  . Pyuria 10/21/2016  . Loose stools 10/21/2016  . Confusion 10/20/2016  . Blepharitis 08/04/2015  . Arteriosclerosis of coronary artery 08/04/2015  . Epiphora 08/04/2015  . Episcleritis 08/04/2015  . 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 Glaucoma suspect 08/04/2015  . Hypercholesterolemia 08/04/2015  . Obstructive apnea 08/04/2015  . 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 Acute encephalopathy 12/27/2014  . Type 2 diabetes mellitus (HCC) 12/27/2014  . HTN (hypertension) 12/27/2014  . HLD (hyperlipidemia) 12/27/2014  . CAD (coronary artery disease) 12/27/2014  . RA (rheumatoid arthritis) (HCC) 12/27/2014  . Arthritis of knee, degenerative 03/05/2013  . Blood glucose elevated 11/26/2012  . Essential (primary) hypertension 09/05/2011  . Adiposity 09/05/2011  . Arthritis, degenerative 09/05/2011  . 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 Apnea, sleep 09/05/2011  . H/O high risk medication treatment 06/01/2011  . Degenerative arthritis of hip 12/15/2010  . Rheumatoid arthritis involving multiple joints (HCC) 10/22/2010    Past Surgical History:  Procedure Laterality Date  . ABDOMINAL HYSTERECTOMY    . BLEPHAROPLASTY    . CARDIAC SURGERY    . CATARACT EXTRACTION      OB History    Gravida  2   Para  2   Term      Preterm      AB      Living        SAB      TAB      Ectopic      Multiple      Live Births               Home Medications    Prior to Admission medications   Medication Sig Start Date End Date Taking? Authorizing Provider  aspirin EC 81 MG tablet Take 1 tablet by mouth daily. 10/09/13  Yes [provider]  atorvastatin (LIPITOR) 10 MG tablet Take 1 tablet by mouth daily. 10/06/14  Yes [provider]  carvedilol (COREG) 6.25 MG tablet Take 6.25 mg by mouth 2 (two) times daily.  11/05/14  Yes [provider]  Cholecalciferol (VITAMIN D3) 5000 UNITS TABS Take 1 tablet by mouth daily.   Yes [provider]  folic acid (FOLVITE) 1 MG tablet Take 1 tablet by mouth daily. 10/06/14  Yes [provider]  hydrOXYzine (ATARAX/VISTARIL) 25 MG tablet Take 25 mg by  mouth at bedtime. 08/30/16  Yes [provider]  methotrexate (RHEUMATREX) 2.5 MG tablet Take 17.5 mg by mouth once a week.  12/05/14  Yes [provider]  Multiple Vitamins-Minerals (MULTIVITAMIN ADULT PO) Take 1 tablet by mouth daily. 04/02/07  Yes [provider]  NONFORMULARY OR COMPOUNDED ITEM [facility] compound:  Onychomycosis Nail Lacquer - Fluconazole 2%, Terbinafine 1%, DMSO, apply to the affected area daily.  +11Refills. 03/05/15  Yes Stover, Titorya, DPM  potassium chloride SA (K-DUR,KLOR-CON) 20 MEQ tablet Take 40 mEq by mouth daily.  12/22/14  Yes [provider]  triamcinolone cream (KENALOG) 0.1 % Apply 1 application topically daily  as needed.  11/18/14  Yes [provider]  ZETIA 10 MG tablet Take 10 mg by mouth daily.  09/18/14  Yes [provider]  cephALEXin (KEFLEX) 500 MG capsule Take 1 capsule (500 mg total) by mouth 2 (two) times daily. 12/20/19   Tommie Sams, DO  oxybutynin (DITROPAN-XL) 5 MG 24 hr tablet Take 5 mg by mouth daily. 10/10/16   [provider]    Family History Family History  Problem Relation Age of Onset  . Hypertension Mother   . Cancer Mother   . Glaucoma Mother   . Cancer Father   . Heart attack Sister   . Diabetes Brother   . Stroke Brother   . Glaucoma Brother     Social History Social History   Tobacco Use  . Smoking status: Never Smoker  . Smokeless tobacco: Never Used  Vaping Use  . Vaping Use: Never used  Substance Use Topics  . Alcohol use: No    Alcohol/week: 0.0 standard drinks  . Drug use: No     Allergies   Zocor [simvastatin], Enalaprilat, Flexeril [cyclobenzaprine], and Verapamil   Review of Systems Review of Systems  Constitutional: Negative for fever.  Gastrointestinal: Negative.   Genitourinary: Positive for dysuria, frequency and urgency.   Physical Exam Triage Vital Signs ED Triage Vitals  Enc Vitals Group     BP 12/20/19 1012 (!) 152/74     Pulse Rate 12/20/19 1012 (!) 58     Resp 12/20/19 1012 16     Temp 12/20/19 1012 97.9 F (36.6 C)     Temp src --      SpO2 12/20/19 1012 99 %     Weight 12/20/19 1009 184 lb 11.9 oz (83.8 kg)     Height 12/20/19 1009 5' 6.5" (1.689 m)     Head Circumference --      Peak Flow --      Pain Score 12/20/19 1009 3     Pain Loc --      Pain Edu? --      Excl. in GC? --    Updated Vital Signs BP (!) 152/74 (BP Location: Left Arm)   Pulse (!) 58   Temp 97.9 F (36.6 C)   Resp 16   Ht 5' 6.5" (1.689 m)   Wt 83.8 kg   SpO2 99%   BMI 29.37 kg/m   Visual Acuity Right Eye Distance:   Left Eye Distance:   Bilateral Distance:    Right Eye Near:   Left Eye Near:      Bilateral Near:     Physical Exam Constitutional:      General: She is not in acute distress.    Appearance: Normal appearance. She is not ill-appearing.  HENT:     Head: Normocephalic and atraumatic.  Eyes:  General:        Right eye: No discharge.        Left eye: No discharge.     Conjunctiva/sclera: Conjunctivae normal.  Cardiovascular:     Rate and Rhythm: Normal rate and regular rhythm.  Pulmonary:     Effort: Pulmonary effort is normal.     Breath sounds: Normal breath sounds. No wheezing, rhonchi or rales.  Abdominal:     General: There is no distension.     Palpations: Abdomen is soft.     Tenderness: There is no abdominal tenderness.  Neurological:     Mental Status: She is alert.  Psychiatric:        Mood and Affect: Mood normal.        Behavior: Behavior normal.    UC Treatments / Results  Labs (all labs ordered are listed, but only abnormal results are displayed) Labs Reviewed  URINALYSIS, COMPLETE (UACMP) WITH MICROSCOPIC - Abnormal; Notable for the following components:      Result Value   Hgb urine dipstick TRACE (*)    Leukocytes,Ua TRACE (*)    Bacteria, UA MANY (*)    All other components within normal limits  URINE CULTURE    EKG   Radiology No results found.  Procedures Procedures (including critical care time)  Medications Ordered in UC Medications - No data to display  Initial Impression / Assessment and Plan / UC Course  I have reviewed the triage vital signs and the nursing notes.  Pertinent labs & imaging results that were available during my care of the patient were reviewed by me and considered in my medical decision making (see chart for details).    79 year old female presents with UTI.  Sending culture.  Placed on Keflex.  Final Clinical Impressions(s) / UC Diagnoses   Final diagnoses:  Acute cystitis without hematuria     Discharge Instructions     You have a UTI.  Antibiotic twice daily as directed.  Take  care  Dr. Adriana Simas    ED Prescriptions    Medication Sig Dispense Auth. Provider   cephALEXin (KEFLEX) 500 MG capsule Take 1 capsule (500 mg total) by mouth 2 (two) times daily. 14 capsule Everlene Other G, DO     PDMP not reviewed this encounter.   Tommie Sams, DO 12/20/19 1046

## 2019-12-22 LAB — URINE CULTURE: Culture: >=100000 — AB

## 2020-05-20 ENCOUNTER — Ambulatory Visit
Admission: EM | Admit: 2020-05-20 | Discharge: 2020-05-20 | Disposition: A | Discharge status: Home / Self Care | Payer: Medicare Other | Attending: Family Medicine | Admitting: Family Medicine

## 2020-05-20 ENCOUNTER — Other Ambulatory Visit: Payer: Self-pay

## 2020-05-20 DIAGNOSIS — R300 Dysuria: Secondary | ICD-10-CM | POA: Diagnosis not present

## 2020-05-20 DIAGNOSIS — R350 Frequency of micturition: Secondary | ICD-10-CM | POA: Diagnosis not present

## 2020-05-20 DIAGNOSIS — N3000 Acute cystitis without hematuria: Secondary | ICD-10-CM

## 2020-05-20 LAB — URINALYSIS, COMPLETE (UACMP) WITH MICROSCOPIC
Specific Gravity, Urine: >1.03 — ABNORMAL HIGH (ref 1.005–1.030)
pH: 5 (ref 5.0–8.0)

## 2020-05-20 MED ORDER — CEPHALEXIN 500 MG PO CAPS: 500.0000 mg | ORAL_CAPSULE | Freq: Two times a day (BID) | ORAL | 0 refills | Status: AC

## 2020-05-20 MED ORDER — CEPHALEXIN 500 MG PO CAPS: 500.0000 mg | ORAL_CAPSULE | Freq: Two times a day (BID) | ORAL | 0 refills | Status: DC

## 2020-05-20 NOTE — Discharge Instructions (Signed)

## 2020-05-20 NOTE — ED Provider Notes (Signed)
[facility]    CSN: [id number] Arrival date & time: 05/20/20  4098      History   Chief Complaint Chief Complaint  Patient presents with  . Dysuria    HPI Tammy Obrien is a 80 y.o. female presenting for approximately 4-day history of dysuria, urinary frequency and urgency.  Patient says she believes she has a urinary tract infection.  She has been taking Azo and says that it helps with symptoms briefly.  Denies any fever, fatigue, body aches.  Admits to some mild lower back aching and lower abdominal cramping.  Denies any hematuria, abnormal vaginal discharge/itching.  No other concerns.  Soak  HPI  Past Medical History:  Diagnosis Date  . Arterial stent thrombosis (HCC)   . CAD (coronary artery disease)   . CHF (congestive heart failure) (HCC)   . HLD (hyperlipidemia)   . Hypertension   . RA (rheumatoid arthritis) (HCC)   . Thyroid disease   . Type 2 diabetes mellitus [facility])     Patient Active Problem List   Diagnosis Date Noted  . Dehydration 10/21/2016  . Hyponatremia 10/21/2016  . Acute on chronic renal insufficiency 10/21/2016  . CKD (chronic kidney disease), stage II 10/21/2016  . Hyperglycemia 10/21/2016  . Pyuria 10/21/2016  . Loose stools 10/21/2016  . Confusion 10/20/2016  . Blepharitis 08/04/2015  . Arteriosclerosis of coronary artery 08/04/2015  . Epiphora 08/04/2015  . Episcleritis 08/04/2015  . 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 Glaucoma suspect 08/04/2015  . Hypercholesterolemia 08/04/2015  . Obstructive apnea 08/04/2015  . 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 Acute encephalopathy 12/27/2014  . Type 2 diabetes mellitus (HCC) 12/27/2014  . HTN (hypertension) 12/27/2014  . HLD (hyperlipidemia) 12/27/2014  . CAD (coronary artery disease) 12/27/2014  . RA (rheumatoid arthritis) (HCC) 12/27/2014  . Arthritis of knee, degenerative 03/05/2013  . Blood glucose elevated 11/26/2012   . Essential (primary) hypertension 09/05/2011  . Adiposity 09/05/2011  . Arthritis, degenerative 09/05/2011  . 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 Apnea, sleep 09/05/2011  . H/O high risk medication treatment 06/01/2011  . Degenerative arthritis of hip 12/15/2010  . Rheumatoid arthritis involving multiple joints (HCC) 10/22/2010    Past Surgical History:  Procedure Laterality Date  . ABDOMINAL HYSTERECTOMY    . BLEPHAROPLASTY    . CARDIAC SURGERY    . CATARACT EXTRACTION      OB History    Gravida  2   Para  2   Term      Preterm      AB      Living        SAB      IAB      Ectopic      Multiple      Live Births               Home Medications    Prior to Admission medications   Medication Sig Start Date End Date Taking? Authorizing Provider  aspirin EC 81 MG tablet Take 1 tablet by mouth daily. 10/09/13   [provider]  atorvastatin (LIPITOR) 10 MG tablet Take 1 tablet by mouth daily. 10/06/14   [provider]  carvedilol (COREG) 6.25 MG tablet Take 6.25 mg by mouth 2 (two) times daily.  11/05/14   [provider]  cephALEXin (KEFLEX) 500 MG capsule Take 1 capsule (500 mg total) by mouth 2 (two) times daily for 7  days. 05/20/20 05/27/20  Shirlee Latch, PA-C  Cholecalciferol (VITAMIN D3) 5000 UNITS TABS Take 1 tablet by mouth daily.    [provider]  folic acid (FOLVITE) 1 MG tablet Take 1 tablet by mouth daily. 10/06/14   [provider]  hydrOXYzine (ATARAX/VISTARIL) 25 MG tablet Take 25 mg by mouth at bedtime. 08/30/16   [provider]  methotrexate (RHEUMATREX) 2.5 MG tablet Take 17.5 mg by mouth once a week.  12/05/14   [provider]  Multiple Vitamins-Minerals (MULTIVITAMIN ADULT PO) Take 1 tablet by mouth daily. 04/02/07   [provider]  NONFORMULARY OR COMPOUNDED ITEM [facility] compound:  Onychomycosis Nail Lacquer - Fluconazole 2%,  Terbinafine 1%, DMSO, apply to the affected area daily.  +11Refills. 03/05/15   Asencion Islam, DPM  oxybutynin (DITROPAN-XL) 5 MG 24 hr tablet Take 5 mg by mouth daily. 10/10/16   [provider]  potassium chloride SA (K-DUR,KLOR-CON) 20 MEQ tablet Take 40 mEq by mouth daily.  12/22/14   [provider]  triamcinolone cream (KENALOG) 0.1 % Apply 1 application topically daily as needed.  11/18/14   [provider]  ZETIA 10 MG tablet Take 10 mg by mouth daily.  09/18/14   [provider]    Family History Family History  Problem Relation Age of Onset  . Hypertension Mother   . Cancer Mother   . Glaucoma Mother   . Cancer Father   . Heart attack Sister   . Diabetes Brother   . Stroke Brother   . Glaucoma Brother     Social History Social History   Tobacco Use  . Smoking status: Never Smoker  . Smokeless tobacco: Never Used  Vaping Use  . Vaping Use: Never used  Substance Use Topics  . Alcohol use: No    Alcohol/week: 0.0 standard drinks  . Drug use: No     Allergies   Zocor [simvastatin], Enalaprilat, Flexeril [cyclobenzaprine], and Verapamil   Review of Systems Review of Systems  Constitutional: Negative for chills and fever.  Gastrointestinal: Negative for abdominal pain, diarrhea, nausea and vomiting.  Genitourinary: Positive for dysuria, frequency and urgency. Negative for decreased urine volume, flank pain, hematuria, pelvic pain, vaginal bleeding, vaginal discharge and vaginal pain.  Musculoskeletal: Negative for back pain.  Skin: Negative for rash.     Physical Exam Triage Vital Signs ED Triage Vitals  Enc Vitals Group     BP 05/20/20 1007 (!) 145/79     Pulse Rate 05/20/20 1007 66     Resp 05/20/20 1007 16     Temp 05/20/20 1007 98.3 F (36.8 C)     Temp Source 05/20/20 1007 Oral     SpO2 05/20/20 1007 95 %     Weight 05/20/20 1009 191 lb (86.6 kg)     Height 05/20/20 1009 5' 6.5" (1.689 m)     Head Circumference --       Peak Flow --      Pain Score 05/20/20 1008 4     Pain Loc --      Pain Edu? --      Excl. in GC? --    No data found.  Updated Vital Signs BP (!) 145/79   Pulse 66   Temp 98.3 F (36.8 C) (Oral)   Resp 16   Ht 5' 6.5" (1.689 m)   Wt 191 lb (86.6 kg)   SpO2 95%   BMI 30.37 kg/m       Physical  Exam Vitals and nursing note reviewed.  Constitutional:      General: She is not in acute distress.    Appearance: Normal appearance. She is not ill-appearing or toxic-appearing.  HENT:     Head: Normocephalic and atraumatic.  Eyes:     General: No scleral icterus.       Right eye: No discharge.        Left eye: No discharge.     Conjunctiva/sclera: Conjunctivae normal.  Cardiovascular:     Rate and Rhythm: Normal rate and regular rhythm.     Heart sounds: Normal heart sounds.  Pulmonary:     Effort: Pulmonary effort is normal. No respiratory distress.     Breath sounds: Normal breath sounds.  Abdominal:     Palpations: Abdomen is soft.     Tenderness: There is abdominal tenderness (mild) in the right lower quadrant, suprapubic area and left lower quadrant. There is no right CVA tenderness or left CVA tenderness.  Musculoskeletal:     Cervical back: Neck supple.  Skin:    General: Skin is dry.  Neurological:     General: No focal deficit present.     Mental Status: She is alert. Mental status is at baseline.     Motor: No weakness.     Gait: Gait normal.  Psychiatric:        Mood and Affect: Mood normal.        Behavior: Behavior normal.        Thought Content: Thought content normal.      UC Treatments / Results  Labs (all labs ordered are listed, but only abnormal results are displayed) Labs Reviewed  URINALYSIS, COMPLETE (UACMP) WITH MICROSCOPIC - Abnormal; Notable for the following components:      Result Value   Color, Urine ORANGE (*)    APPearance HAZY (*)    Specific Gravity, Urine >1.030 (*)    Glucose, UA   (*)    Value: TEST NOT REPORTED DUE  TO COLOR INTERFERENCE OF URINE PIGMENT   Hgb urine dipstick   (*)    Value: TEST NOT REPORTED DUE TO COLOR INTERFERENCE OF URINE PIGMENT   Bilirubin Urine   (*)    Value: TEST NOT REPORTED DUE TO COLOR INTERFERENCE OF URINE PIGMENT   Ketones, ur   (*)    Value: TEST NOT REPORTED DUE TO COLOR INTERFERENCE OF URINE PIGMENT   Protein, ur   (*)    Value: TEST NOT REPORTED DUE TO COLOR INTERFERENCE OF URINE PIGMENT   Nitrite   (*)    Value: TEST NOT REPORTED DUE TO COLOR INTERFERENCE OF URINE PIGMENT   Leukocytes,Ua   (*)    Value: TEST NOT REPORTED DUE TO COLOR INTERFERENCE OF URINE PIGMENT   Bacteria, UA MANY (*)    All other components within normal limits  URINE CULTURE    EKG   Radiology No results found.  Procedures Procedures (including critical care time)  Medications Ordered in UC Medications - No data to display  Initial Impression / Assessment and Plan / UC Course  I have reviewed the triage vital signs and the nursing notes.  Pertinent labs & imaging results that were available during my care of the patient were reviewed by me and considered in my medical decision making (see chart for details).   80 year old female presenting for dysuria, urinary frequency urgency for about 4 days.  Has been taking Azo.  Urinalysis performed today with interference from Azo.  Will  send urine for culture and treat for UTI based on her symptoms.  Sent in Keflex.  Advised her to increase rest and fluids.  Return and ED precautions reviewed patient.   Final Clinical Impressions(s) / UC Diagnoses   Final diagnoses:  Acute cystitis without hematuria  Dysuria  Urinary frequency     Discharge Instructions     UTI: Based on either symptoms or urinalysis, you may have a urinary tract infection. We will send the urine for culture and call with results in a few days. Begin antibiotics at this time. Your symptoms should be much improved over the next 2-3 days. Increase rest and fluid  intake. If for some reason symptoms are worsening or not improving after a couple of days or the urine culture determines the antibiotics you are taking will not treat the infection, the antibiotics may be changed. Return or go to ER for fever, back pain, worsening urinary pain, discharge, increased blood in urine. May take Tylenol or Motrin OTC for pain relief or consider AZO if no contraindications     ED Prescriptions    Medication Sig Dispense Auth. Provider   cephALEXin (KEFLEX) 500 MG capsule  (Status: Discontinued) Take 1 capsule (500 mg total) by mouth 2 (two) times daily for 7 days. 14 capsule Eusebio Friendly B, PA-C   cephALEXin (KEFLEX) 500 MG capsule Take 1 capsule (500 mg total) by mouth 2 (two) times daily for 7 days. 14 capsule Shirlee Latch, PA-C     PDMP not reviewed this encounter.   Shirlee Latch, PA-C 05/20/20 1058

## 2020-05-20 NOTE — ED Triage Notes (Signed)
Pt reports having dysuria and burning with urination x4 days. sts she have been taking AZO without relief.

## 2020-05-21 LAB — URINE CULTURE: Culture: >=100000 — AB

## 2020-05-22 LAB — URINE CULTURE

## 2021-01-19 ENCOUNTER — Other Ambulatory Visit: Payer: Self-pay

## 2021-01-19 ENCOUNTER — Ambulatory Visit (INDEPENDENT_AMBULATORY_CARE_PROVIDER_SITE_OTHER): Payer: Medicare Other | Admitting: Podiatry

## 2021-01-19 VITALS — BP 152/76 | HR 68 | Resp 16

## 2021-01-19 DIAGNOSIS — L603 Nail dystrophy: Secondary | ICD-10-CM | POA: Diagnosis not present

## 2021-01-19 NOTE — Progress Notes (Signed)
   HPI: 80 y.o. female presenting today for evaluation of discoloration and dystrophy to the right hallux nail plate.  She has been applying over-the-counter topical antifungals with no improvement.  She has no pain associated to the area.  She presents for further treatment and evaluation  Past Medical History:  Diagnosis Date   Arterial stent thrombosis (HCC)    CAD (coronary artery disease)    CHF (congestive heart failure) (HCC)    HLD (hyperlipidemia)    Hypertension    RA (rheumatoid arthritis) (HCC)    Thyroid disease    Type 2 diabetes mellitus (HCC)      Physical Exam: General: The patient is alert and oriented x3 in no acute distress.  Dermatology: Skin is warm, dry and supple bilateral lower extremities. Negative for open lesions or macerations.  The right hallux nail plate is mostly gone with only some adhered underlying debris.  The majority of the nail plate is absent.  There is no open wound.  Vascular: Palpable pedal pulses bilaterally. No edema or erythema noted. Capillary refill within normal limits.  Neurological: Epicritic and protective threshold grossly intact bilaterally.   Musculoskeletal Exam: No pedal deformity noted  Assessment: 1.  Dystrophic nail with the majority of the nail which is lost right hallux   Plan of Care:  1. Patient evaluated.   2.  OTC Tolcylen antifungal topical was provided for the patient to apply daily 3.  Recommend good supportive shoes that do not constrict the toebox area 4.  Return to clinic as needed   Felecia Shelling, DPM [facility]  Dr. Felecia Shelling, DPM    [address] [place], [place] [address]                Office [phone]  Fax [phone]

## 2022-09-19 ENCOUNTER — Ambulatory Visit
Admission: EM | Admit: 2022-09-19 | Discharge: 2022-09-19 | Disposition: A | Discharge status: Home / Self Care | Payer: Medicare Other | Attending: Family Medicine | Admitting: Family Medicine

## 2022-09-19 DIAGNOSIS — F411 Generalized anxiety disorder: Secondary | ICD-10-CM | POA: Insufficient documentation

## 2022-09-19 DIAGNOSIS — I10 Essential (primary) hypertension: Secondary | ICD-10-CM | POA: Diagnosis not present

## 2022-09-19 LAB — BASIC METABOLIC PANEL
Anion gap: 8 (ref 5–15)
BUN: 23 mg/dL (ref 8–23)
CO2: 27 mmol/L (ref 22–32)
Calcium: 9.4 mg/dL (ref 8.9–10.3)
Chloride: 98 mmol/L (ref 98–111)
Creatinine, Ser: 0.94 mg/dL (ref 0.44–1.00)
GFR, Estimated: >60 mL/min (ref >60)
Glucose, Bld: 152 mg/dL — ABNORMAL HIGH (ref 70–99)
Potassium: 3.8 mmol/L (ref 3.5–5.1)
Sodium: 133 mmol/L — ABNORMAL LOW (ref 135–145)

## 2022-09-19 LAB — CBC WITH DIFFERENTIAL/PLATELET
Abs Immature Granulocytes: 0.07 10*3/uL (ref 0.00–0.07)
Basophils Absolute: 0.1 10*3/uL (ref 0.0–0.1)
Basophils Relative: 1 %
Eosinophils Absolute: 0.3 10*3/uL (ref 0.0–0.5)
Eosinophils Relative: 3 %
HCT: 44.1 % (ref 36.0–46.0)
Hemoglobin: 13.9 g/dL (ref 12.0–15.0)
Immature Granulocytes: 1 %
Lymphocytes Relative: 10 %
Lymphs Abs: 1.1 10*3/uL (ref 0.7–4.0)
MCH: 25.2 pg — ABNORMAL LOW (ref 26.0–34.0)
MCHC: 31.5 g/dL (ref 30.0–36.0)
MCV: 79.9 fL — ABNORMAL LOW (ref 80.0–100.0)
Monocytes Absolute: 1 10*3/uL (ref 0.1–1.0)
Monocytes Relative: 10 %
Neutro Abs: 8 10*3/uL — ABNORMAL HIGH (ref 1.7–7.7)
Neutrophils Relative %: 75 %
Platelets: 330 10*3/uL (ref 150–400)
RBC: 5.52 MIL/uL — ABNORMAL HIGH (ref 3.87–5.11)
RDW: 14.7 % (ref 11.5–15.5)
WBC: 10.5 10*3/uL (ref 4.0–10.5)
nRBC: 0 % (ref 0.0–0.2)

## 2022-09-19 LAB — TSH: TSH: 2.022 u[IU]/mL (ref 0.350–4.500)

## 2022-09-19 MED ORDER — CLONIDINE HCL 0.1 MG PO TABS
0.1000 mg | ORAL_TABLET | Freq: Once | ORAL | Status: AC
  Administered 2022-09-19: 0.1 mg via ORAL

## 2022-09-19 NOTE — ED Triage Notes (Addendum)
Pt presents to UC with "nervous" feeling. Pt states she took her BP yesterday and was 163/100, this morning was 148/100. Pt requesting something for her nerves. Pt does also state she has been having trouble sleeping.

## 2022-09-19 NOTE — ED Provider Notes (Signed)
[facility]    CSN: [id number] Arrival date & time: 09/19/22  0825      History   Chief Complaint No chief complaint on file.   HPI Tammy Obrien is a 82 y.o. female.   HPI   Tammy Obrien presents for nervousness and elevated blood pressure. She was watching the changes to politics on the news.  She was upset that Biden stepped down.  She started feeling jittery and turned then decided to lay down.  Notes her BP was elevated. She decided to come to the urgent care instead of her primary care doctor. She takes Coreg, losartan and triamterene.  Denies missing doses of antihypertensive medications. Denies chest pain, lower extremity edema, PND, exertional dyspnea, lightheadedness, headaches and vision changes. She uses her CPAP.   Endorses palpitations.        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   Hyperglycemia 10/21/2016   Pyuria 10/21/2016   Loose stools 10/21/2016   Confusion 10/20/2016   Blepharitis 08/04/2015   Arteriosclerosis of coronary artery 08/04/2015   Epiphora 08/04/2015   Episcleritis 08/04/2015   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   Glaucoma suspect 08/04/2015   Hypercholesterolemia 08/04/2015   Obstructive apnea 08/04/2015   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   Acute encephalopathy 12/27/2014   Type 2 diabetes mellitus (HCC) 12/27/2014   HTN (hypertension) 12/27/2014   HLD (hyperlipidemia) 12/27/2014   CAD (coronary artery disease) 12/27/2014   RA  (rheumatoid arthritis) (HCC) 12/27/2014   Arthritis of knee, degenerative 03/05/2013   Blood glucose elevated 11/26/2012   Essential (primary) hypertension 09/05/2011   Adiposity 09/05/2011   Arthritis, degenerative 09/05/2011   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 Authorizing Provider  aspirin EC 81 MG tablet Take 1 tablet by mouth daily. 10/09/13  Yes [provider]  carvedilol (COREG) 6.25 MG tablet Take 6.25 mg by mouth 2 (two) times daily.  11/05/14  Yes [provider]  Cholecalciferol (VITAMIN D3) 5000 UNITS TABS  Take 1 tablet by mouth daily.   Yes [provider]  folic acid (FOLVITE) 1 MG tablet Take 1 tablet by mouth daily. 10/06/14  Yes [provider]  Multiple Vitamins-Minerals (MULTIVITAMIN ADULT PO) Take 1 tablet by mouth daily. 04/02/07  Yes [provider]  potassium chloride SA (K-DUR,KLOR-CON) 20 MEQ tablet Take 40 mEq by mouth daily.  12/22/14  Yes [provider]  ZETIA 10 MG tablet Take 10 mg by mouth daily.  09/18/14  Yes [provider]  atorvastatin (LIPITOR) 10 MG tablet Take 1 tablet by mouth daily. 10/06/14   [provider]  hydrOXYzine (ATARAX/VISTARIL) 25 MG tablet Take 25 mg by mouth at bedtime. 08/30/16   [provider]  methotrexate (RHEUMATREX) 2.5 MG  tablet Take 17.5 mg by mouth once a week.  12/05/14   [provider]  NONFORMULARY OR COMPOUNDED ITEM [facility] compound:  Onychomycosis Nail Lacquer - Fluconazole 2%, Terbinafine 1%, DMSO, apply to the affected area daily.  +11Refills. 03/05/15   Asencion Islam, DPM  oxybutynin (DITROPAN-XL) 5 MG 24 hr tablet Take 5 mg by mouth daily. 10/10/16   [provider]  triamcinolone cream (KENALOG) 0.1 % Apply 1 application topically daily as needed.  11/18/14   [provider]    Family History Family History  Problem Relation Age of Onset   Hypertension Mother    Cancer Mother    Glaucoma Mother    Cancer Father    Heart attack Sister    Diabetes Brother    Stroke Brother    Glaucoma Brother     Social History Social History   Tobacco Use   Smoking status: Never   Smokeless tobacco: Never  Vaping Use   Vaping status: Never Used  Substance Use Topics   Alcohol use: No    Alcohol/week: 0.0 standard drinks of alcohol   Drug use: No     Allergies   Zocor [simvastatin], Enalaprilat, Flexeril [cyclobenzaprine], and Verapamil   Review of Systems Review of Systems: negative unless otherwise stated in HPI.      Physical Exam Triage Vital Signs ED Triage Vitals  Encounter Vitals Group     BP      Systolic BP Percentile      Diastolic BP Percentile      Pulse      Resp      Temp      Temp src      SpO2      Weight      Height      Head Circumference      Peak Flow      Pain Score      Pain Loc      Pain Education      Exclude from Growth Chart    No data found.  Updated Vital Signs BP (!) 153/76 (BP Location: Left Arm)   Pulse 74   Temp 98.2 F (36.8 C) (Oral)   SpO2 95%   Visual Acuity Right Eye Distance:   Left Eye Distance:   Bilateral Distance:    Right Eye Near:   Left Eye Near:    Bilateral Near:     Physical Exam GEN:     alert, well appearing elderly female and no distress    HENT:  mucus membranes moist,no  nasal discharge  NECK:  supple, no goiter, good ROM RESP:  clear to auscultation bilaterally, no increased work of breathing  CVS:   regular rate and  rhythm, no murmur, distal pulses intact   ABD:  soft, non-tender; bowel sounds present; no palpable masses EXT:   normal ROM, atraumatic, no appreciable edema  NEURO:  normal without focal findings,  speech normal, alert and oriented   Skin:   warm and dry, no rash, normal skin turgor Psych:  Cognition and judgment appear intact. Alert, communicative  and cooperative with normal attention span and concentration. No apparent delusions, illusions, hallucinations     UC Treatments / Results  Labs (all labs ordered are listed, but only abnormal results are displayed) Labs Reviewed  BASIC METABOLIC PANEL - Abnormal; Notable for the following components:      Result Value   Sodium 133 (*)    Glucose, Bld 152 (*)    All other components within normal limits  CBC WITH DIFFERENTIAL/PLATELET - Abnormal; Notable for the following components:   RBC 5.52 (*)    MCV 79.9 (*)    MCH 25.2 (*)    Neutro Abs 8.0 (*)    All other components within normal limits  TSH    EKG  If EKG performed, see my interpretation in the MDM section  Radiology No results found.   Procedures Procedures (including critical care time)  Medications Ordered in UC Medications  cloNIDine (CATAPRES) tablet 0.1 mg (0.1 mg Oral Given 09/19/22 1012)    Initial Impression / Assessment and Plan / UC Course  I have reviewed the triage vital signs and the nursing notes.  Pertinent labs & imaging results that were available during my care of the patient were reviewed by me and considered in my medical decision making (see chart for details).       Patient is a 82 y.o. female  who presents for nervousness and elevated BP after watching news coverage after the president withdrew from the upcoming presidential race. Overall, patient is nontoxic-appearing and afebrile.  Obtained CBC, BMP, TSH and EKG.  EKG showing NSR without acute ST or T wave changes; personally interpreted by me. CBC and BMP grossly unremarkable. TSH is normal.   Zacari is hypertensive here.  BP 187/77 then after Clonidine 0.1 mg and  sitting was 153/76.  I suspect high blood pressure issue is secondary to emotional stressors. Takes Coreg, losartan and triamtereneCoreg, losartan and triamterene.  Denies needing refills. Recommended she check herblood pressure and follow up with her primary care provider in the next  week.   Pt feeling better after Clonidine which also has calming effects. Previously prescribed Atarax and considered benzo but there are not a good medication options given her age.     ED and return precautions given and patient voiced understanding. Discussed MDM, treatment plan and plan for follow-up with patient who agrees with plan.    Final Clinical Impressions(s) / UC Diagnoses   Final diagnoses:  Elevated blood pressure reading with diagnosis of hypertension  Anxiety state     Discharge Instructions      Turn the TV away from politics, if your symptoms return or you get too worked up.    Continue monitoring  your blood pressure at home first thing in the morning prior to eating and drinking and journal this for PCP follow-up. Today your blood pressure is likely elevated as a result of your current symptoms and discomfort. If you remain persistently elevated after your symptoms improve, you may need medical management. Other recommendations for high blood pressure are to decrease the amount of salt in your diet, exercise (150 minutes of moderate  intensity exercise weekly), weight loss.  You should follow up with your primary doctor in 2-3 days regarding today's urgent care visit. If your symptoms persist, you may need a additional cardiovascular evaluation.      ED Prescriptions   None    I have reviewed the PDMP during this encounter.   Katha Cabal,  DO 09/19/22 1535

## 2022-09-19 NOTE — Discharge Instructions (Addendum)
Turn the TV away from politics, if your symptoms return or you get too worked up.    Continue monitoring  your blood pressure at home first thing in the morning prior to eating and drinking and journal this for PCP follow-up. Today your blood pressure is likely elevated as a result of your current symptoms and discomfort. If you remain persistently elevated after your symptoms improve, you may need medical management. Other recommendations for high blood pressure are to decrease the amount of salt in your diet, exercise (150 minutes of moderate intensity exercise weekly), weight loss.  You should follow up with your primary doctor in 2-3 days regarding today's urgent care visit. If your symptoms persist, you may need a additional cardiovascular evaluation.
